# Patient Record
Sex: Male | Born: 1985 | Race: Black or African American | Hispanic: No | Marital: Single | Smoking: Never smoker
Health system: Southern US, Community
[De-identification: ages and names within clinical notes are randomized; demographics above are authoritative.]

## PROBLEM LIST (undated history)

## (undated) DIAGNOSIS — R0600 Dyspnea, unspecified: Secondary | ICD-10-CM

## (undated) DIAGNOSIS — N289 Disorder of kidney and ureter, unspecified: Secondary | ICD-10-CM

## (undated) DIAGNOSIS — K219 Gastro-esophageal reflux disease without esophagitis: Secondary | ICD-10-CM

## (undated) DIAGNOSIS — L659 Nonscarring hair loss, unspecified: Secondary | ICD-10-CM

## (undated) DIAGNOSIS — I10 Essential (primary) hypertension: Secondary | ICD-10-CM

## (undated) HISTORY — PX: SPLENECTOMY: SUR1306

## (undated) HISTORY — PX: WISDOM TOOTH EXTRACTION: SHX21

---

## 2005-08-10 ENCOUNTER — Emergency Department: Payer: Self-pay | Admitting: General Practice

## 2006-06-01 HISTORY — PX: SPLENECTOMY: SUR1306

## 2006-08-21 ENCOUNTER — Emergency Department: Payer: Self-pay | Admitting: Emergency Medicine

## 2006-08-21 ENCOUNTER — Other Ambulatory Visit: Payer: Self-pay

## 2007-10-01 ENCOUNTER — Emergency Department: Payer: Self-pay | Admitting: Emergency Medicine

## 2007-11-20 ENCOUNTER — Emergency Department: Payer: Self-pay | Admitting: Emergency Medicine

## 2009-01-30 ENCOUNTER — Emergency Department: Payer: Self-pay | Admitting: Emergency Medicine

## 2010-09-29 ENCOUNTER — Emergency Department: Payer: Self-pay | Admitting: Emergency Medicine

## 2012-08-26 ENCOUNTER — Emergency Department: Payer: Self-pay | Admitting: Emergency Medicine

## 2012-08-31 ENCOUNTER — Emergency Department: Payer: Self-pay | Admitting: Emergency Medicine

## 2012-10-22 ENCOUNTER — Emergency Department: Payer: Self-pay | Admitting: Emergency Medicine

## 2013-02-20 ENCOUNTER — Emergency Department: Payer: Self-pay | Admitting: Emergency Medicine

## 2013-02-20 LAB — BASIC METABOLIC PANEL
Chloride: 102 mmol/L (ref 98–107)
Creatinine: 0.76 mg/dL (ref 0.60–1.30)
Glucose: 70 mg/dL (ref 65–99)
Osmolality: 273 (ref 275–301)
Potassium: 4.1 mmol/L (ref 3.5–5.1)
Sodium: 137 mmol/L (ref 136–145)

## 2013-02-20 LAB — CBC
HGB: 18 g/dL (ref 13.0–18.0)
MCHC: 34.3 g/dL (ref 32.0–36.0)
Platelet: 561 10*3/uL — ABNORMAL HIGH (ref 150–440)

## 2013-02-20 LAB — TROPONIN I: Troponin-I: <0.02 ng/mL

## 2013-08-17 ENCOUNTER — Emergency Department: Payer: Self-pay | Admitting: Internal Medicine

## 2015-02-24 ENCOUNTER — Emergency Department
Admission: EM | Admit: 2015-02-24 | Discharge: 2015-02-25 | Disposition: A | Discharge status: Home / Self Care | Payer: BLUE CROSS/BLUE SHIELD | Attending: Emergency Medicine | Admitting: Emergency Medicine

## 2015-02-24 ENCOUNTER — Emergency Department: Payer: BLUE CROSS/BLUE SHIELD

## 2015-02-24 DIAGNOSIS — R112 Nausea with vomiting, unspecified: Secondary | ICD-10-CM | POA: Insufficient documentation

## 2015-02-24 DIAGNOSIS — R1031 Right lower quadrant pain: Secondary | ICD-10-CM | POA: Insufficient documentation

## 2015-02-24 HISTORY — DX: Disorder of kidney and ureter, unspecified: N28.9

## 2015-02-24 LAB — COMPREHENSIVE METABOLIC PANEL
ALBUMIN: 4.9 g/dL (ref 3.5–5.0)
ALK PHOS: 70 U/L (ref 38–126)
ALT: 31 U/L (ref 17–63)
AST: 30 U/L (ref 15–41)
Anion gap: 15 (ref 5–15)
BILIRUBIN TOTAL: 1.9 mg/dL — AB (ref 0.3–1.2)
BUN: 7 mg/dL (ref 6–20)
CO2: 27 mmol/L (ref 22–32)
Calcium: 9.8 mg/dL (ref 8.9–10.3)
Chloride: 97 mmol/L — ABNORMAL LOW (ref 101–111)
Creatinine, Ser: 0.65 mg/dL (ref 0.61–1.24)
GFR calc Af Amer: >60 mL/min (ref >60)
GFR calc non Af Amer: >60 mL/min (ref >60)
GLUCOSE: 94 mg/dL (ref 65–99)
POTASSIUM: 3.5 mmol/L (ref 3.5–5.1)
SODIUM: 139 mmol/L (ref 135–145)
TOTAL PROTEIN: 8.6 g/dL — AB (ref 6.5–8.1)

## 2015-02-24 LAB — CBC
HEMATOCRIT: 49.1 % (ref 40.0–52.0)
HEMOGLOBIN: 16.5 g/dL (ref 13.0–18.0)
MCH: 30.3 pg (ref 26.0–34.0)
MCHC: 33.6 g/dL (ref 32.0–36.0)
MCV: 90.1 fL (ref 80.0–100.0)
Platelets: 608 10*3/uL — ABNORMAL HIGH (ref 150–440)
RBC: 5.45 MIL/uL (ref 4.40–5.90)
RDW: 13.2 % (ref 11.5–14.5)
WBC: 18.2 10*3/uL — ABNORMAL HIGH (ref 3.8–10.6)

## 2015-02-24 LAB — LIPASE, BLOOD: Lipase: 14 U/L — ABNORMAL LOW (ref 22–51)

## 2015-02-24 MED ORDER — SODIUM CHLORIDE 0.9 % IV BOLUS (SEPSIS)
1000.0000 mL | Freq: Once | INTRAVENOUS | Status: AC
  Administered 2015-02-24: 1000 mL via INTRAVENOUS

## 2015-02-24 MED ORDER — MORPHINE SULFATE (PF) 4 MG/ML IV SOLN
4.0000 mg | Freq: Once | INTRAVENOUS | Status: AC
  Administered 2015-02-24: 4 mg via INTRAVENOUS
  Filled 2015-02-24: qty 1

## 2015-02-24 MED ORDER — ONDANSETRON HCL 4 MG/2ML IJ SOLN
4.0000 mg | Freq: Once | INTRAMUSCULAR | Status: AC
  Administered 2015-02-24: 4 mg via INTRAVENOUS
  Filled 2015-02-24: qty 2

## 2015-02-24 MED ORDER — IOHEXOL 240 MG/ML SOLN
25.0000 mL | Freq: Once | INTRAMUSCULAR | Status: AC | PRN
  Administered 2015-02-24: 25 mL via ORAL

## 2015-02-24 MED ORDER — ONDANSETRON HCL 4 MG/2ML IJ SOLN
4.0000 mg | Freq: Once | INTRAMUSCULAR | Status: AC | PRN
  Administered 2015-02-24: 4 mg via INTRAVENOUS
  Filled 2015-02-24: qty 2

## 2015-02-24 MED ORDER — IOHEXOL 300 MG/ML  SOLN
100.0000 mL | Freq: Once | INTRAMUSCULAR | Status: AC | PRN
  Administered 2015-02-24: 100 mL via INTRAVENOUS

## 2015-02-24 NOTE — ED Notes (Signed)
Patient transported to CT 

## 2015-02-24 NOTE — ED Notes (Signed)
Patient reports abdominal pain today with nausea and vomiting.  Patient to ED via EMS.

## 2015-02-24 NOTE — ED Provider Notes (Addendum)
[facility] Emergency Department Provider Note   ____________________________________________  Time seen:  I have reviewed the triage vital signs and the triage nursing note.  HISTORY  Chief Complaint Abdominal Pain   Historian Patient  HPI Alejandro Collier is a 29 y.o. male with a history of splenectomy, who is presenting with abdominal pain for several hours. Onset was gradual and now worse. He has had nausea and a few episodes of vomiting. Reports no bowel movement for 3 days. Pain is central/periumbilical and in the right side abdomen. Pain is moderate. Never had pain like this before. Not recently been ill.    Past Medical History  Diagnosis Date  . Renal disorder     There are no active problems to display for this patient.   Past Surgical History  Procedure Laterality Date  . Splenectomy  2008    No current outpatient prescriptions on file.  Allergies Review of patient's allergies indicates no known allergies.  No family history on file.  Social History Social History  Substance Use Topics  . Smoking status: Not on file  . Smokeless tobacco: Not on file  . Alcohol Use: Not on file    Review of Systems  Constitutional: Negative for fever. Eyes: Negative for visual changes. ENT: Negative for sore throat. Cardiovascular: Negative for chest pain. Respiratory: Negative for shortness of breath. Gastrointestinal: Per history of present illness Genitourinary: Negative for dysuria. Musculoskeletal: Negative for back pain. Skin: Negative for rash. Neurological: Negative for headache. 10 point Review of Systems otherwise negative ____________________________________________   PHYSICAL EXAM:  VITAL SIGNS: ED Triage Vitals  Enc Vitals Group     BP 02/24/15 2110 132/92 mmHg     Pulse Rate 02/24/15 2110 71     Resp 02/24/15 2110 20     Temp 02/24/15 2110 97.6 F (36.4 C)     Temp Source 02/24/15 2110 Oral     SpO2 02/24/15 2110  96 %     Weight 02/24/15 2110 150 lb (68.04 kg)     Height 02/24/15 2110  (1.778 m)     Head Cir --      Peak Flow --      Pain Score 02/24/15 2110 10     Pain Loc --      Pain Edu? --      Excl. in GC? --      Constitutional: Alert and oriented. Well appearing and in no distress. Eyes: Conjunctivae are normal. PERRL. Normal extraocular movements. ENT   Head: Normocephalic and atraumatic.   Nose: No congestion/rhinnorhea.   Mouth/Throat: Mucous membranes are moist.   Neck: No stridor. Cardiovascular/Chest: Normal rate, regular rhythm.  No murmurs, rubs, or gallops. Respiratory: Normal respiratory effort without tachypnea nor retractions. Breath sounds are clear and equal bilaterally. No wheezes/rales/rhonchi. Gastrointestinal: Soft. Moderate tenderness mid abdomen and right side. Mild guarding. No rebound. No distention. Genitourinary/rectal:Deferred Musculoskeletal: Nontender with normal range of motion in all extremities. No joint effusions.  No lower extremity tenderness.  No edema. Neurologic:  Normal speech and language. No gross or focal neurologic deficits are appreciated. Skin:  Skin is warm, dry and intact. No rash noted. Psychiatric: Mood and affect are normal. Speech and behavior are normal. Patient exhibits appropriate insight and judgment.  ____________________________________________   EKG I, Governor Rooks, MD, the attending physician have personally viewed and interpreted all ECGs.  No EKG performed ____________________________________________  LABS (pertinent positives/negatives)  Lipase 14 Comprehensive metabolic panel without significant abnormality White blood  count 18.2 Hemoglobin 16.5 Platelet count 608 Urinalysis pending ____________________________________________  RADIOLOGY All Xrays were viewed by me. Imaging interpreted by Radiologist.  CT abdomen and pelvis with contrast:  Pending __________________________________________  PROCEDURES  Procedure(s) performed: None  Critical Care performed: None  ____________________________________________   ED COURSE / ASSESSMENT AND PLAN  CONSULTATIONS: None  Pertinent labs & imaging results that were available during my care of the patient were reviewed by me and considered in my medical decision making (see chart for details).  Patient's abdominal pain is central and somewhat right-sided raising concern for the possibility of appendicitis. His white blood cell count is elevated. He has had abdominal surgery in the past, is reporting no bowel movement for couple days, so obstruction is possibly, although he is having no abdominal distention.  And shared decision making I discussed with the patient I recommend CT scan of the abdomen for further evaluation. Patient was treated for symptoms with morphine and Zofran as well as fluid bolus.  CT scan of abdomen and pelvis with contrast and urinalysis are pending. Patient care transferred to Dr. Dolores Frame at shift change.  Patient / Family / Caregiver informed of clinical course, medical decision-making process, and agree with plan.    ___________________________________________   FINAL CLINICAL IMPRESSION(S) / ED DIAGNOSES   Final diagnoses:  Abdominal pain, right lower quadrant       Governor Rooks, MD 02/24/15 2311  Governor Rooks, MD 02/24/15 2312

## 2015-02-24 NOTE — ED Notes (Signed)
CT called for pt finished contrast  

## 2015-02-25 LAB — URINALYSIS COMPLETE WITH MICROSCOPIC (ARMC ONLY)
BACTERIA UA: NONE SEEN
Bilirubin Urine: NEGATIVE
Glucose, UA: NEGATIVE mg/dL
Hgb urine dipstick: NEGATIVE
Leukocytes, UA: NEGATIVE
Nitrite: NEGATIVE
PROTEIN: 100 mg/dL — AB
Specific Gravity, Urine: >1.06 — ABNORMAL HIGH (ref 1.005–1.030)
Squamous Epithelial / LPF: NONE SEEN
pH: 5 (ref 5.0–8.0)

## 2015-02-25 NOTE — ED Provider Notes (Signed)
-----------------------------------------   1:37 AM on 02/25/2015 -----------------------------------------  CT Abdomen/Pelvis w/ Contrast interpreted per Dr. Manus Gunning: 1. No definite acute abnormality in the abdomen/pelvis. 2. Trace free fluid in the pelvis is nonspecific, however likely reactive. 3. 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  Updated patient of CT results. Patient texting on the phone. Patient will attempt to provide urine specimen. Patient asking for Orange juice.   ----------------------------------------- 2:38 AM on 02/25/2015 -----------------------------------------  Updated patient of urinalysis results. Repeat exam and abdomen; patient has no tenderness, particularly in right upper quadrant. Strict return precautions given. Patient verbalizes understanding and agrees with plan of care.   Irean Hong, MD 02/25/15 [phone]

## 2015-02-25 NOTE — Discharge Instructions (Signed)
Return to the ER for worsening symptoms, persistent vomiting, fever, difficulty breathing or other concerns.  Abdominal Pain Many things can cause abdominal pain. Usually, abdominal pain is not caused by a disease and will improve without treatment. It can often be observed and treated at home. Your health care provider will do a physical exam and possibly order blood tests and X-rays to help determine the seriousness of your pain. However, in many cases, more time must pass before a clear cause of the pain can be found. Before that point, your health care provider may not know if you need more testing or further treatment. HOME CARE INSTRUCTIONS  Monitor your abdominal pain for any changes. The following actions may help to alleviate any discomfort you are experiencing:  Only take over-the-counter or prescription medicines as directed by your health care provider.  Do not take laxatives unless directed to do so by your health care provider.  Try a clear liquid diet (broth, tea, or water) as directed by your health care provider. Slowly move to a bland diet as tolerated. SEEK MEDICAL CARE IF:  You have unexplained abdominal pain.  You have abdominal pain associated with nausea or diarrhea.  You have pain when you urinate or have a bowel movement.  You experience abdominal pain that wakes you in the night.  You have abdominal pain that is worsened or improved by eating food.  You have abdominal pain that is worsened with eating fatty foods.  You have a fever. SEEK IMMEDIATE MEDICAL CARE IF:   Your pain does not go away within 2 hours.  You keep throwing up (vomiting).  Your pain is felt only in portions of the abdomen, such as the right side or the left lower portion of the abdomen.  You pass bloody or black tarry stools. MAKE SURE YOU:  Understand these instructions.   Will watch your condition.   Will get help right away if you are not doing well or get worse.  Document  Released: 02/25/2005 Document Revised: 05/23/2013 Document Reviewed: 01/25/2013 [facility] Patient Information 2015 [place], [place]. This information is not intended to replace advice given to you by your health care provider. Make sure you discuss any questions you have with your health care provider.

## 2015-08-31 ENCOUNTER — Encounter (HOSPITAL_COMMUNITY): Payer: Self-pay

## 2015-08-31 ENCOUNTER — Emergency Department (HOSPITAL_COMMUNITY)
Admission: EM | Admit: 2015-08-31 | Discharge: 2015-08-31 | Disposition: A | Discharge status: Home / Self Care | Payer: BLUE CROSS/BLUE SHIELD | Attending: Emergency Medicine | Admitting: Emergency Medicine

## 2015-08-31 DIAGNOSIS — Z87448 Personal history of other diseases of urinary system: Secondary | ICD-10-CM | POA: Insufficient documentation

## 2015-08-31 DIAGNOSIS — I10 Essential (primary) hypertension: Secondary | ICD-10-CM | POA: Insufficient documentation

## 2015-08-31 DIAGNOSIS — R251 Tremor, unspecified: Secondary | ICD-10-CM | POA: Insufficient documentation

## 2015-08-31 DIAGNOSIS — F419 Anxiety disorder, unspecified: Secondary | ICD-10-CM | POA: Insufficient documentation

## 2015-08-31 DIAGNOSIS — R000 Tachycardia, unspecified: Secondary | ICD-10-CM | POA: Insufficient documentation

## 2015-08-31 DIAGNOSIS — T50905A Adverse effect of unspecified drugs, medicaments and biological substances, initial encounter: Secondary | ICD-10-CM | POA: Insufficient documentation

## 2015-08-31 HISTORY — DX: Essential (primary) hypertension: I10

## 2015-08-31 LAB — CBC
HEMATOCRIT: 43.9 % (ref 39.0–52.0)
HEMOGLOBIN: 15.7 g/dL (ref 13.0–17.0)
MCH: 32.3 pg (ref 26.0–34.0)
MCHC: 35.8 g/dL (ref 30.0–36.0)
MCV: 90.3 fL (ref 78.0–100.0)
Platelets: 360 10*3/uL (ref 150–400)
RBC: 4.86 MIL/uL (ref 4.22–5.81)
RDW: 13.5 % (ref 11.5–15.5)
WBC: 13.4 10*3/uL — AB (ref 4.0–10.5)

## 2015-08-31 LAB — BASIC METABOLIC PANEL
ANION GAP: 12 (ref 5–15)
BUN: <5 mg/dL — ABNORMAL LOW (ref 6–20)
CALCIUM: 9.3 mg/dL (ref 8.9–10.3)
CO2: 25 mmol/L (ref 22–32)
Chloride: 93 mmol/L — ABNORMAL LOW (ref 101–111)
Creatinine, Ser: 0.73 mg/dL (ref 0.61–1.24)
GLUCOSE: 106 mg/dL — AB (ref 65–99)
Potassium: 4.2 mmol/L (ref 3.5–5.1)
SODIUM: 130 mmol/L — AB (ref 135–145)

## 2015-08-31 LAB — ETHANOL

## 2015-08-31 MED ORDER — SODIUM CHLORIDE 0.9 % IV BOLUS (SEPSIS)
1000.0000 mL | Freq: Once | INTRAVENOUS | Status: AC
  Administered 2015-08-31: 1000 mL via INTRAVENOUS

## 2015-08-31 MED ORDER — LORAZEPAM 2 MG/ML IJ SOLN
1.0000 mg | Freq: Once | INTRAMUSCULAR | Status: AC
  Administered 2015-08-31: 1 mg via INTRAVENOUS
  Filled 2015-08-31: qty 1

## 2015-08-31 NOTE — ED Notes (Signed)
Canceled code stemi  

## 2015-08-31 NOTE — ED Provider Notes (Signed)
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 He noticed when he woke up this morning at 6 AM he was feeling very shaky and anxious. Those symptoms have persisted throughout the day and have not resolved. Patient is not having any trouble with chest pain. He denies any shortness of breath. No nausea vomiting or fevers. He drank some alcohol last night but denies any other drug use. He denies any suicidal or homicidal ideations. He is not feeling depressed  He has used this drug previously and has not had a reaction like this. Past Medical History  Diagnosis Date  . Renal disorder   . Hypertension    Past Surgical History  Procedure Laterality Date  . Splenectomy  2008  . Splenectomy     History reviewed. No pertinent family history. Social History  Substance Use Topics  . Smoking status: Never Smoker   . Smokeless tobacco: None  . Alcohol Use: 12.6 oz/week    21 Cans of beer per week    Review of Systems  All other systems reviewed and are negative.     Allergies  Review of patient's allergies indicates no known allergies.  Home Medications   Prior to Admission medications   Not on File   BP 165/86 mmHg  Pulse 93  Temp(Src) 99 F (37.2 C) (Oral)  Resp 20  Ht  (1.778 m)  Wt 68.04 kg  BMI 21.52 kg/m2  SpO2 100% Physical Exam  Constitutional: He appears well-developed and well-nourished. No distress.  HENT:  Head: Normocephalic and atraumatic.  Right Ear: External ear normal.  Left Ear: External ear normal.  Eyes: Conjunctivae are normal. Right eye exhibits no discharge. Left eye exhibits no discharge. No scleral icterus.  Neck: Neck supple. No tracheal deviation present.  Cardiovascular: Regular rhythm and intact distal pulses.  Tachycardia present.   Pulmonary/Chest: Effort normal and breath sounds  normal. No stridor. No respiratory distress. He has no wheezes. He has no rales.  Abdominal: Soft. Bowel sounds are normal. He exhibits no distension. There is no tenderness. There is no rebound and no guarding.  Musculoskeletal: He exhibits no edema or tenderness.  Neurological: He is alert. He has normal strength. He displays tremor. No cranial nerve deficit (no facial droop, extraocular movements intact, no slurred speech) or sensory deficit. He exhibits normal muscle tone. He displays no seizure activity. Coordination normal.  Skin: Skin is warm. No rash noted.  Diaphoresis noted on the scalp  Psychiatric: He has a normal mood and affect.  Nursing note and vitals reviewed.   ED Course  Procedures (including critical care time) Labs Review Labs Reviewed  BASIC METABOLIC PANEL - Abnormal; Notable for the following:    Sodium 130 (*)    Chloride 93 (*)    Glucose, Bld 106 (*)    BUN <5 (*)    All other components within normal limits  CBC - Abnormal; Notable for the following:    WBC 13.4 (*)    All other components within normal limits  ETHANOL  URINE RAPID DRUG SCREEN, HOSP PERFORMED    Imaging Review No results found. I have personally reviewed and evaluated these images and lab results as part of my medical decision-making.   EKG Interpretation   Date/Time:  Saturday August 31 2015 13:59:53 EDT Ventricular Rate:  97 PR Interval:  131 QRS Duration: 92 QT Interval:  330 QTC Calculation: 419 R Axis:   70 Text Interpretation:  Sinus rhythm Probable left atrial enlargement RSR'  in V1 or V2, right VCD or RVH Probable left ventricular hypertrophy ST  elev, probable normal early repol pattern , noted on prior ECG Confirmed  by Shaarav Ripple  MD-J, Gizella [place] ([id number]) on 08/31/2015 2:02:38 PM     Medications  sodium chloride 0.9 % bolus 1,000 mL (0 mLs Intravenous Stopped 08/31/15 1500)  LORazepam (ATIVAN) injection 1 mg (1 mg Intravenous Given 08/31/15 1415)    MDM   Final diagnoses:   Adverse drug reaction, initial encounter    Anxiety has improved with ativan.  Sx are related to his illegal drug use.  No signs of life threatening toxicity.  Stable for discharge.    Linwood DibblesJon Kennisha Qin, MD 08/31/15 [phone]

## 2015-08-31 NOTE — ED Notes (Signed)
Pt reports taking Molly last night and feeling "not right" since 0600 this morning.  Pt reports anxiety and jitteriness.  Pt denies any pain at this time.

## 2015-08-31 NOTE — Discharge Instructions (Signed)

## 2016-10-24 ENCOUNTER — Emergency Department
Admission: EM | Admit: 2016-10-24 | Discharge: 2016-10-24 | Disposition: A | Discharge status: Home / Self Care | Payer: BLUE CROSS/BLUE SHIELD | Attending: Emergency Medicine | Admitting: Emergency Medicine

## 2016-10-24 ENCOUNTER — Emergency Department: Payer: BLUE CROSS/BLUE SHIELD

## 2016-10-24 DIAGNOSIS — I10 Essential (primary) hypertension: Secondary | ICD-10-CM | POA: Insufficient documentation

## 2016-10-24 DIAGNOSIS — R079 Chest pain, unspecified: Secondary | ICD-10-CM | POA: Insufficient documentation

## 2016-10-24 LAB — BASIC METABOLIC PANEL
Anion gap: 10 (ref 5–15)
BUN: 7 mg/dL (ref 6–20)
CO2: 28 mmol/L (ref 22–32)
CREATININE: 0.74 mg/dL (ref 0.61–1.24)
Calcium: 9.1 mg/dL (ref 8.9–10.3)
Chloride: 103 mmol/L (ref 101–111)
GFR calc Af Amer: >60 mL/min (ref >60)
GLUCOSE: 92 mg/dL (ref 65–99)
Potassium: 3.3 mmol/L — ABNORMAL LOW (ref 3.5–5.1)
SODIUM: 141 mmol/L (ref 135–145)

## 2016-10-24 LAB — CBC
HCT: 46 % (ref 40.0–52.0)
Hemoglobin: 15.8 g/dL (ref 13.0–18.0)
MCH: 31.2 pg (ref 26.0–34.0)
MCHC: 34.2 g/dL (ref 32.0–36.0)
MCV: 91.1 fL (ref 80.0–100.0)
PLATELETS: 442 10*3/uL — AB (ref 150–440)
RBC: 5.05 MIL/uL (ref 4.40–5.90)
RDW: 13.7 % (ref 11.5–14.5)
WBC: 15 10*3/uL — ABNORMAL HIGH (ref 3.8–10.6)

## 2016-10-24 LAB — TROPONIN I
Troponin I: <0.03 ng/mL (ref <0.03)
Troponin I: <0.03 ng/mL (ref <0.03)

## 2016-10-24 MED ORDER — LISINOPRIL 5 MG PO TABS: 5.0000 mg | ORAL_TABLET | Freq: Every day | ORAL | 1 refills | Status: DC

## 2016-10-24 MED ORDER — ASPIRIN 81 MG PO CHEW
324.0000 mg | CHEWABLE_TABLET | Freq: Once | ORAL | Status: AC
  Administered 2016-10-24: 324 mg via ORAL
  Filled 2016-10-24: qty 4

## 2016-10-24 NOTE — ED Provider Notes (Signed)
[facility] Emergency Department Provider Note  ____________________________________________  Time seen: Approximately 5:33 AM  I have reviewed the triage vital signs and the nursing notes.   HISTORY  Chief Complaint Chest Pain   HPI Alejandro Collier is a 31 y.o. male with a history of IGA nephropathy, hypertension, and alcohol abuse who presents for evaluation of chest pain. Patient reports that he has daily episodes of chest pain that he describes as a dull pain located in the center of his chest, nonradiating, constant lasting for a few hours at a time and not associated with nausea, vomiting, shortness of breath or diaphoresis. Patient reports that he is here today because his family asked him to come to be evaluated. He has not been compliant with his antihypertensives for more than a year. He reports that he drinks 6 x 24 ounce beers a day. He denies drug use. He denies personal or family history of ischemic heart disease. Patient reports that he has had these episodes once a day with the last episode at 3 AM this morning. He is asymptomatic at this time. No abdominal pain, no melena, no coffee ground emesis, no fever or chills, no URI symptoms, no dysuria hematuria.  Past Medical History:  Diagnosis Date  . Hypertension   . Renal disorder     There are no active problems to display for this patient.   Past Surgical History:  Procedure Laterality Date  . SPLENECTOMY  2008  . SPLENECTOMY      Prior to Admission medications   Medication Sig Start Date End Date Taking? Authorizing Provider  lisinopril (PRINIVIL,ZESTRIL) 5 MG tablet Take 1 tablet (5 mg total) by mouth daily. 10/24/16 11/23/16  Nita SickleVeronese, [place] , MD    Allergies Patient has no known allergies.  Family History No fh of ischemic heart disease  Social History Social History  Substance Use Topics  . Smoking status: Never Smoker  . Smokeless tobacco: Not on file  . Alcohol use 12.6  oz/week    21 Cans of beer per week    Review of Systems  Constitutional: Negative for fever. Eyes: Negative for visual changes. ENT: Negative for sore throat. Neck: No neck pain  Cardiovascular: + chest pain. Respiratory: Negative for shortness of breath. Gastrointestinal: Negative for abdominal pain, vomiting or diarrhea. Genitourinary: Negative for dysuria. Musculoskeletal: Negative for back pain. Skin: Negative for rash. Neurological: Negative for headaches, weakness or numbness. Psych: No SI or HI  ____________________________________________   PHYSICAL EXAM:  VITAL SIGNS: ED Triage Vitals  Enc Vitals Group     BP 10/24/16 0359 (!) 155/98     Pulse Rate 10/24/16 0359 75     Resp 10/24/16 0359 18     Temp 10/24/16 0359 97.4 F (36.3 C)     Temp Source 10/24/16 0359 Oral     SpO2 10/24/16 0359 100 %     Weight 10/24/16 0357 150 lb (68 kg)     Height 10/24/16 0357 5\' 10"  (1.778 m)     Head Circumference --      Peak Flow --      Pain Score --      Pain Loc --      Pain Edu? --      Excl. in GC? --     Constitutional: Alert and oriented. Well appearing and in no apparent distress. HEENT:      Head: Normocephalic and atraumatic.         Eyes: Conjunctivae are  normal. Sclera is non-icteric.       Mouth/Throat: Mucous membranes are moist.       Neck: Supple with no signs of meningismus. Cardiovascular: Regular rate and rhythm. No murmurs, gallops, or rubs. 2+ symmetrical distal pulses are present in all extremities. No JVD. Respiratory: Normal respiratory effort. Lungs are clear to auscultation bilaterally. No wheezes, crackles, or rhonchi.  Gastrointestinal: Soft, non tender, and non distended with positive bowel sounds. No rebound or guarding. Genitourinary: No CVA tenderness. Musculoskeletal: Nontender with normal range of motion in all extremities. No edema, cyanosis, or erythema of extremities. Neurologic: Normal speech and language. Face is symmetric. Moving  all extremities. No gross focal neurologic deficits are appreciated. Skin: Skin is warm, dry and intact. No rash noted. Psychiatric: Mood and affect are normal. Speech and behavior are normal.  ____________________________________________   LABS (all labs ordered are listed, but only abnormal results are displayed)  Labs Reviewed  BASIC METABOLIC PANEL - Abnormal; Notable for the following:       Result Value   Potassium 3.3 (*)    All other components within normal limits  CBC - Abnormal; Notable for the following:    WBC 15.0 (*)    Platelets 442 (*)    All other components within normal limits  TROPONIN I  TROPONIN I   ____________________________________________  EKG  ED ECG REPORT I, Nita Sickle, the attending physician, personally viewed and interpreted this ECG.  Normal sinus rhythm, rate of 74, normal intervals, normal axis, LVH, no ST elevations or depressions. Unchanged from prior from April 2017 ____________________________________________  RADIOLOGY  CXR:  Negative ____________________________________________   PROCEDURES  Procedure(s) performed: None Procedures Critical Care performed:  None ____________________________________________   INITIAL IMPRESSION / ASSESSMENT AND PLAN / ED COURSE  Chest pain in a 31 y.o. male with low suspicion for cardiac (HEART score 1) or other serious etiology (including aortic dissection, pneumonia, pneumothorax, or pulmonary embolism) based his history and physical exam in the ED today. EKG normal. Plan for labs including CBC, chemistries and troponin now and in 3 hours, CXR and re-evaluation for disposition. Will give full dose ASA. Will observe patient on cardiac monitor while in the ED and pain control. If work up negative in the ED will refer to cardiology for outpatient evaluation. Discussed risks of medication non compliance and heavy drinking with patient.  ----------------------------------------- 5:40 AM on  10/24/2016 -----------------------------------------   OBSERVATION CARE: This patient is being placed under observation care for the following reasons: Chest pain with repeat testing to rule out ischemia   ----------------------------------------- 7:00 AM on 10/24/2016 ----------------------------------------- Patient remains CP free. 2nd troponin pending.Care transferred to Dr. Alphonzo Lemmings.     Pertinent labs & imaging results that were available during my care of the patient were reviewed by me and considered in my medical decision making (see chart for details).    ____________________________________________   FINAL CLINICAL IMPRESSION(S) / ED DIAGNOSES  Final diagnoses:  Chest pain, unspecified type      NEW MEDICATIONS STARTED DURING THIS VISIT:  Discharge Medication List as of 10/24/2016  6:41 AM    START taking these medications   Details  lisinopril (PRINIVIL,ZESTRIL) 5 MG tablet Take 1 tablet (5 mg total) by mouth daily., Starting Sat 10/24/2016, Until Mon 11/23/2016, Print         Note:  This document was prepared using Dragon voice recognition software and may include unintentional dictation errors.    Nita Sickle, MD 10/25/16 [phone]

## 2016-10-24 NOTE — ED Triage Notes (Signed)
Patient reports having pain across his chest for several month.  When ask what was different tonight, patient does not respond.

## 2016-10-24 NOTE — Discharge Instructions (Signed)

## 2016-10-24 NOTE — ED Notes (Signed)
Troponin drawn and sent to lab.

## 2017-07-19 ENCOUNTER — Encounter: Payer: Self-pay | Admitting: Emergency Medicine

## 2017-07-19 ENCOUNTER — Emergency Department: Payer: 59

## 2017-07-19 ENCOUNTER — Other Ambulatory Visit: Payer: Self-pay

## 2017-07-19 ENCOUNTER — Emergency Department
Admission: EM | Admit: 2017-07-19 | Discharge: 2017-07-19 | Disposition: A | Discharge status: Home / Self Care | Payer: 59 | Attending: Emergency Medicine | Admitting: Emergency Medicine

## 2017-07-19 DIAGNOSIS — I10 Essential (primary) hypertension: Secondary | ICD-10-CM | POA: Insufficient documentation

## 2017-07-19 DIAGNOSIS — Y999 Unspecified external cause status: Secondary | ICD-10-CM | POA: Insufficient documentation

## 2017-07-19 DIAGNOSIS — Y939 Activity, unspecified: Secondary | ICD-10-CM | POA: Insufficient documentation

## 2017-07-19 DIAGNOSIS — S82401A Unspecified fracture of shaft of right fibula, initial encounter for closed fracture: Secondary | ICD-10-CM

## 2017-07-19 DIAGNOSIS — R52 Pain, unspecified: Secondary | ICD-10-CM

## 2017-07-19 DIAGNOSIS — Y929 Unspecified place or not applicable: Secondary | ICD-10-CM | POA: Diagnosis not present

## 2017-07-19 DIAGNOSIS — W010XXA Fall on same level from slipping, tripping and stumbling without subsequent striking against object, initial encounter: Secondary | ICD-10-CM | POA: Diagnosis not present

## 2017-07-19 DIAGNOSIS — S99911A Unspecified injury of right ankle, initial encounter: Secondary | ICD-10-CM | POA: Diagnosis present

## 2017-07-19 MED ORDER — IBUPROFEN 800 MG PO TABS: 800.0000 mg | ORAL_TABLET | Freq: Three times a day (TID) | ORAL | 0 refills | Status: DC | PRN

## 2017-07-19 MED ORDER — IBUPROFEN 800 MG PO TABS
800.0000 mg | ORAL_TABLET | Freq: Once | ORAL | Status: AC
  Administered 2017-07-19: 800 mg via ORAL
  Filled 2017-07-19: qty 1

## 2017-07-19 MED ORDER — OXYCODONE-ACETAMINOPHEN 5-325 MG PO TABS: 1.0000 | ORAL_TABLET | Freq: Four times a day (QID) | ORAL | 0 refills | Status: DC | PRN

## 2017-07-19 MED ORDER — OXYCODONE-ACETAMINOPHEN 5-325 MG PO TABS
1.0000 | ORAL_TABLET | Freq: Once | ORAL | Status: AC
  Administered 2017-07-19: 1 via ORAL
  Filled 2017-07-19: qty 1

## 2017-07-19 NOTE — ED Triage Notes (Signed)
Pt to ed with c/o right ankle pain since last night when he fell.  Pt states he tripped and fell from standing. swelling noted to right ankle. Pt states unable to bear weight on ankle.

## 2017-07-19 NOTE — Discharge Instructions (Signed)
Splint and ambulate with crutches until evaluation by orthopedics °

## 2017-07-19 NOTE — ED Provider Notes (Signed)
[facility] Emergency Department Provider Note   ____________________________________________   First MD Initiated Contact with Patient 07/19/17 1112     (approximate)  I have reviewed the triage vital signs and the nursing notes.   HISTORY  Chief Complaint Ankle Pain    HPI Alejandro Collier is a 32 y.o. male patient complaining of right ankle pain secondary to a trip and fall last night.  Obvious edema to the right ankle.  Patient unable to bear weight secondary to complaint of pain.  Patient rates pain as a 10/10.  Patient described the pain is "aching".  No pulses measured prior to arrival.  Past Medical History:  Diagnosis Date  . Hypertension   . Renal disorder     There are no active problems to display for this patient.   Past Surgical History:  Procedure Laterality Date  . SPLENECTOMY  2008  . SPLENECTOMY      Prior to Admission medications   Medication Sig Start Date End Date Taking? Authorizing Provider  ibuprofen (ADVIL,MOTRIN) 800 MG tablet Take 1 tablet (800 mg total) by mouth every 8 (eight) hours as needed. 07/19/17   Joni Reining, PA-C  lisinopril (PRINIVIL,ZESTRIL) 5 MG tablet Take 1 tablet (5 mg total) by mouth daily. 10/24/16 11/23/16  Nita Sickle, MD    Allergies Patient has no known allergies.  History reviewed. No pertinent family history.  Social History Social History   Tobacco Use  . Smoking status: Never Smoker  . Smokeless tobacco: Never Used  Substance Use Topics  . Alcohol use: Yes    Alcohol/week: 12.6 oz    Types: 21 Cans of beer per week  . Drug use: No    Comment: "molly"    Review of Systems Constitutional: No fever/chills Eyes: No visual changes. ENT: No sore throat. Cardiovascular: Denies chest pain. Respiratory: Denies shortness of breath. Gastrointestinal: No abdominal pain.  No nausea, no vomiting.  No diarrhea.  No constipation. Genitourinary: Negative for  dysuria. Musculoskeletal: Right ankle pain.   Skin: Negative for rash. Neurological: Negative for headaches, focal weakness or numbness. Endocrine:Hypertension Hematological/Lymphatic:   ____________________________________________   PHYSICAL EXAM:  VITAL SIGNS: ED Triage Vitals [07/19/17 1003]  Enc Vitals Group     BP 124/69     Pulse Rate 82     Resp 16     Temp 98.2 F (36.8 C)     Temp Source Oral     SpO2 99 %     Weight 147 lb (66.7 kg)     Height      Head Circumference      Peak Flow      Pain Score 10     Pain Loc      Pain Edu?      Excl. in GC?    Constitutional: Alert and oriented. Well appearing and in no acute distress. Neck: No stridor.  No cervical spine tenderness to palpation. Cardiovascular: Normal rate, regular rhythm. Grossly normal heart sounds.  Good peripheral circulation. Respiratory: Normal respiratory effort.  No retractions. Lungs CTAB. Gastrointestinal: Soft and nontender. No distention. No abdominal bruits. No CVA tenderness. Musculoskeletal: No obvious deformity to the right ankle.  Bilateral ankle edema.  Moderate guarding with palpation distal fibula and fibula. Neurologic:  Normal speech and language. No gross focal neurologic deficits are appreciated. No gait instability. Skin:  Skin is warm, dry and intact. No rash noted. Psychiatric: Mood and affect are normal. Speech and behavior are normal.  ____________________________________________   LABS (all labs ordered are listed, but only abnormal results are displayed)  Labs Reviewed - No data to display ____________________________________________  EKG   ____________________________________________  RADIOLOGY  ED MD interpretation: Comminuted fracture of the distal left fibular.  Official radiology report(s): Dg Ankle Complete Right  Result Date: 07/19/2017 CLINICAL DATA:  Pain with fall EXAM: RIGHT ANKLE - COMPLETE 3+ VIEW COMPARISON:  None. FINDINGS: Frontal, oblique,  and lateral views were obtained. There is an obliquely oriented mildly comminuted fracture of the distal fibular diaphysis with slight displacement of fracture fragments. Specifically, there is slight lateral and posterior displacement of the distal major fracture fragment with respect to the major proximal fragment. There is generalized soft tissue swelling in the ankle region. No other fracture is evident. There appears to be mild widening of the mortise along the lateral aspect, raising concern for a degree of mortise instability. This finding is most apparent on the oblique view. There is a small metallic foreign body in the soft tissues medial and dorsal to the navicular. There is no appreciable joint space narrowing or erosion. IMPRESSION: Soft tissue swelling with mildly comminuted fracture of the distal fibula. Question mild ankle mortise instability, suspected on the lateral view. Small radiopaque foreign body in the soft tissues dorsal and medial to the navicular. Electronically Signed   By: Bretta BangWilliam  Woodruff III M.D.   On: 07/19/2017 11:47   Dg Foot Complete Right  Result Date: 07/19/2017 CLINICAL DATA:  Acute right foot pain following fall yesterday. Initial encounter. EXAM: RIGHT FOOT COMPLETE - 3+ VIEW COMPARISON:  None. FINDINGS: An oblique fracture of the distal fibula is noted with 4 mm dorsal displacement. A posterior malleolar tibial fracture is noted. A probable medial malleolar tibial fracture is present. No other fracture, subluxation or dislocation identified. IMPRESSION: 1. Oblique fracture of the distal fibula with 4 mm dorsal displacement. 2. Posterior malleolar tibial fracture 3. Probable medial malleolar tibial fracture. 4. Recommended dedicated right ankle radiographs for further evaluation. Electronically Signed   By: Harmon PierJeffrey  Hu M.D.   On: 07/19/2017 10:46    ____________________________________________   PROCEDURES  Procedure(s) performed: None  Procedures  Critical  Care performed: No  ____________________________________________   INITIAL IMPRESSION / ASSESSMENT AND PLAN / ED COURSE  As part of my medical decision making, I reviewed the following data within the electronic MEDICAL RECORD NUMBER    Distal left fibular fracture.  Discussed x-ray findings with patient.  Patient placed in posterior ankle splint and given crutches for ambulation.  Patient advised to contact orthopedics today to schedule follow-up appointment.  Patient given prescription for Percocet and ibuprofen.      ____________________________________________   FINAL CLINICAL IMPRESSION(S) / ED DIAGNOSES  Final diagnoses:  Closed fracture of shaft of right fibula, unspecified fracture morphology, initial encounter     ED Discharge Orders        Ordered    oxyCODONE-acetaminophen (PERCOCET/ROXICET) 5-325 MG tablet  Every 6 hours PRN,   Status:  Discontinued     07/19/17 1200    ibuprofen (ADVIL,MOTRIN) 800 MG tablet  Every 8 hours PRN,   Status:  Discontinued     07/19/17 1200    ibuprofen (ADVIL,MOTRIN) 800 MG tablet  Every 8 hours PRN,   Status:  Discontinued     07/19/17 1202    oxyCODONE-acetaminophen (PERCOCET/ROXICET) 5-325 MG tablet  Every 6 hours PRN,   Status:  Discontinued     07/19/17 1202    ibuprofen (ADVIL,MOTRIN) 800 MG  tablet  Every 8 hours PRN     07/19/17 1205    oxyCODONE-acetaminophen (PERCOCET/ROXICET) 5-325 MG tablet  Every 6 hours PRN,   Status:  Discontinued     07/19/17 1205       Note:  This document was prepared using Dragon voice recognition software and may include unintentional dictation errors.    Joni Reining, PA-C 07/19/17 1214    Emily Filbert, MD 07/19/17 [phone]

## 2017-07-19 NOTE — ED Notes (Signed)
See triage note  States he fell last pm  Twisted right ankle  Swelling and tenderness noted to ankle/foot  Positive pulses

## 2017-07-27 ENCOUNTER — Other Ambulatory Visit: Payer: Self-pay

## 2017-07-27 ENCOUNTER — Encounter
Admission: RE | Admit: 2017-07-27 | Discharge: 2017-07-27 | Disposition: A | Discharge status: Home / Self Care | Payer: BLUE CROSS/BLUE SHIELD | Source: Ambulatory Visit | Attending: Orthopedic Surgery | Admitting: Orthopedic Surgery

## 2017-07-27 HISTORY — DX: Gastro-esophageal reflux disease without esophagitis: K21.9

## 2017-07-27 HISTORY — DX: Dyspnea, unspecified: R06.00

## 2017-07-27 NOTE — Patient Instructions (Signed)
Your procedure is scheduled on: 07-30-17 FRIDAY Report to Same Day Surgery 2nd floor medical mall [facility](Medical Mall Entrance-take elevator on left to 2nd floor.  Check in with surgery information desk.) To find out your arrival time please call [phone] between 1PM - 3PM on 07-29-17 THURSDAY  Remember: Instructions that are not followed completely may result in serious medical risk, up to and including death, or upon the discretion of your surgeon and anesthesiologist your surgery may need to be rescheduled.    _x___ 1. Do not eat food after midnight the night before your procedure. NO GUM OR CANDY AFTER MIDNIGHT.  You may drink clear liquids up to 2 hours before you are scheduled to arrive at the hospital for your procedure.  Do not drink clear liquids within 2 hours of your scheduled arrival to the hospital.  Clear liquids include  --Water or Apple juice without pulp  --Clear carbohydrate beverage such as ClearFast or Gatorade  --Black Coffee or Clear Tea (No milk, no creamers, do not add anything to the coffee or Tea      __x__ 2. No Alcohol for 24 hours before or after surgery.   __x__3. No Smoking or e-cigarettes for 24 prior to surgery.  Do not use any chewable tobacco products for at least 6 hour prior to surgery   ____  4. Bring all medications with you on the day of surgery if instructed.    __x__ 5. Notify your doctor if there is any change in your medical condition     (cold, fever, infections).    x___6. On the morning of surgery brush your teeth with toothpaste and water.  You may rinse your mouth with mouth wash if you wish.  Do not swallow any toothpaste or mouthwash.   Do not wear jewelry, make-up, hairpins, clips or nail polish.  Do not wear lotions, powders, or perfumes. You may wear deodorant.  Do not shave 48 hours prior to surgery. Men may shave face and neck.  Do not bring valuables to the hospital.    [facility] is not responsible for any belongings or  valuables.               Contacts, dentures or bridgework may not be worn into surgery.  Leave your suitcase in the car. After surgery it may be brought to your room.  For patients admitted to the hospital, discharge time is determined by your treatment team.  _  Patients discharged the day of surgery will not be allowed to drive home.  You will need someone to drive you home and stay with you the night of your procedure.    Please read over the following fact sheets that you were given:   [facility] Preparing for Surgery and or MRSA Information   _x___ Take anti-hypertensive listed below, cardiac, seizure, asthma, anti-reflux and psychiatric medicines. These include:  1. YOU MAY TAKE PERCOCET AM OF SURGERY IF NEEDED WITH A SMALL SIP OF WATER  2.  3.  4.  5.  6.  ____Fleets enema or Magnesium Citrate as directed.   _x___ Use CHG Soap or sage wipes as directed on instruction sheet   ____ Use inhalers on the day of surgery and bring to hospital day of surgery  ____ Stop Metformin and Janumet 2 days prior to surgery.    ____ Take 1/2 of usual insulin dose the night before surgery and none on the morning surgery.   ____ Follow recommendations from Cardiologist, Pulmonologist  or PCP regarding stopping Aspirin, Coumadin, Plavix ,Eliquis, Effient, or Pradaxa, and Pletal.  X____Stop Anti-inflammatories such as Advil, Aleve, IBUPROFEN, Motrin, Naproxen, Naprosyn, Goodies powders or aspirin products NOW-OK to take Tylenol OR PERCOCET IF NEEDED   ____ Stop supplements until after surgery.     ____ Bring C-Pap to the hospital.

## 2017-07-28 ENCOUNTER — Encounter
Admission: RE | Admit: 2017-07-28 | Discharge: 2017-07-28 | Disposition: A | Discharge status: Home / Self Care | Payer: 59 | Source: Ambulatory Visit | Attending: Orthopedic Surgery | Admitting: Orthopedic Surgery

## 2017-07-28 DIAGNOSIS — J301 Allergic rhinitis due to pollen: Secondary | ICD-10-CM | POA: Diagnosis not present

## 2017-07-28 DIAGNOSIS — N049 Nephrotic syndrome with unspecified morphologic changes: Secondary | ICD-10-CM | POA: Diagnosis not present

## 2017-07-28 DIAGNOSIS — Y9301 Activity, walking, marching and hiking: Secondary | ICD-10-CM | POA: Diagnosis not present

## 2017-07-28 DIAGNOSIS — S93431A Sprain of tibiofibular ligament of right ankle, initial encounter: Secondary | ICD-10-CM | POA: Diagnosis not present

## 2017-07-28 DIAGNOSIS — W010XXA Fall on same level from slipping, tripping and stumbling without subsequent striking against object, initial encounter: Secondary | ICD-10-CM | POA: Diagnosis not present

## 2017-07-28 DIAGNOSIS — S82851A Displaced trimalleolar fracture of right lower leg, initial encounter for closed fracture: Secondary | ICD-10-CM | POA: Diagnosis present

## 2017-07-28 DIAGNOSIS — E785 Hyperlipidemia, unspecified: Secondary | ICD-10-CM | POA: Diagnosis not present

## 2017-07-28 LAB — CBC
HCT: 48.6 % (ref 40.0–52.0)
HEMOGLOBIN: 16.3 g/dL (ref 13.0–18.0)
MCH: 31.3 pg (ref 26.0–34.0)
MCHC: 33.5 g/dL (ref 32.0–36.0)
MCV: 93.2 fL (ref 80.0–100.0)
PLATELETS: 394 10*3/uL (ref 150–440)
RBC: 5.22 MIL/uL (ref 4.40–5.90)
RDW: 14.3 % (ref 11.5–14.5)
WBC: 14.5 10*3/uL — AB (ref 3.8–10.6)

## 2017-07-28 LAB — BASIC METABOLIC PANEL
ANION GAP: 13 (ref 5–15)
BUN: 13 mg/dL (ref 6–20)
CALCIUM: 9.6 mg/dL (ref 8.9–10.3)
CO2: 24 mmol/L (ref 22–32)
CREATININE: 0.63 mg/dL (ref 0.61–1.24)
Chloride: 96 mmol/L — ABNORMAL LOW (ref 101–111)
GFR calc Af Amer: >60 mL/min (ref >60)
Glucose, Bld: 98 mg/dL (ref 65–99)
Potassium: 4.1 mmol/L (ref 3.5–5.1)
SODIUM: 133 mmol/L — AB (ref 135–145)

## 2017-07-28 NOTE — Pre-Procedure Instructions (Signed)
Progress Notes - in this encounter  Table of Contents for Progress Notes  Karlene LinemanStoeppler, Jennifer G -- 08/27/2014 12:18 PM EDT  Janyth PupaHegde, Akhil S, MD - 08/27/2014 11:35 AM EDT    Karlene LinemanStoeppler, Jennifer G - 08/27/2014 12:18 PM EDT A urine specimen was collected at the visit.  Back to top of Progress Notes Janyth PupaHegde, Akhil S, MD - 08/27/2014 11:35 AM EDT Formatting of this note may be different from the original. Referring Provider: Sharin MonsPegna, Guillaume J, MD   PCP: Sharin MonsGuillaume J Pegna, MD  Teaching Physician: Dr. Bonnell PublicNachman  08/27/2014  Chief Complaint: IgA Nephropathy  HPI: Mr. Alejandro PomfretRalfeil Fitzgerald Collier is a 32 y.o. 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 Per the patient he has been doing well recently. Had been treated for a flare with a steroid taper in 2013 as he was noted to have nephrotic range proteinuria (~5g) at that time. He has had issues with adherence to his medications, particularly his ace-inhibitor and prednisone. He has not taken his Lisinopril for the past 4 months as he had run out of the medication. Has not had any issues with lower extremity edema or stomach pain which are his typical symptoms during a flare. Otherwise he denies fevers, chills, headaches, vision changes, chest pain, shortness of breath, abdominal pain, nausea, vomiting, diarrhea, dysuria, hematuria, syncope or falls.   ROS: 11 systems reviewed and negative except those noted in the history of present illness  PAST MEDICAL HISTORY: Past Medical History  Diagnosis Date  . Hypertension  . IgA nephropathy   ALLERGIES Review of patient's allergies indicates no known allergies.  SOCIAL HISTORY History   Social History  . Marital Status: Single  Spouse Name: N/A  Number of Children: N/A  . Years of Education: N/A   Occupational History  . Not on file.   Social History Main Topics  .  Smoking status: Never Smoker  . Smokeless tobacco: Not on file  . Alcohol Use: Yes  Comment: occassional  . Drug Use: No  . Sexual Activity: Not on file   Other Topics Concern  . Not on file   Social History Narrative    FAMILY HISTORY No family history on file.   MEDICATIONS: Current Outpatient Prescriptions  Medication Sig Dispense Refill  . lisinopril (PRINIVIL,ZESTRIL) 10 MG tablet Take 0.5 tablets (5 mg total) by mouth daily. 30 tablet 3  . triamcinolone (KENALOG) 0.1 % ointment Apply topically two (2) times a day as needed. Apply twice a day as needed for 7 days, do not apply to the face or eyes. 15 g 0  . white petrolatum-mineral oil (EUCERIN) Crea Apply topically every six (6) hours as needed. 120 g 3   No current facility-administered medications for this visit.   PHYSICAL EXAM: Filed Vitals:  08/27/14 1104  BP: 108/88  Pulse: 80  Temp: 36.3 C   CONSTITUTIONAL: Thin, alert, no distress HEENT: Moist mucous membranes, oropharynx clear without erythema or exudate EYES: Extra ocular movements intact. Pupils reactive, sclerae anicteric. NECK: Supple, no lymphadenopathy CARDIOVASCULAR: Regular, 1/6 systolic murmur at the apex, ?S2 splitting, no rubs.  PULM: Clear to auscultation bilaterally GASTROINTESTINAL: Soft, active bowel sounds, nontender EXTREMITIES: No lower extremity edema bilaterally.  SKIN: No rashes or lesions NEUROLOGIC: No focal motor or sensory deficits PSYCH: alert and oriented x 3  MEDICAL DECISION MAKING  Results for orders placed or performed in visit  on 08/27/14  POCT urinalysis dipstick  Result Value Ref Range  Spec Gravity/POC <=1.005 1.003 - 1.030  PH/POC 5.5 5.0 - 9.0  Leuk Esterase/POC Negative NEGATIVE  Nitrite/POC Negative NEGATIVE  Protein/POC Trace NEGATIVE  UA Glucose/POC Negative NEGATIVE  Ketones, POC Negative NEGATIVE  Bilirubin/POC Negative NEGATIVE  Blood/POC Trace NEGATIVE  Urobilinogen/POC 0.2 0.2 TO 1 mg/dL  UA  LOCATION see below    CREATININE  Date Value Ref Range Status  11/23/2013 0.87 0.70 - 1.30 mg/dL Final  16/03/9603 5.40* 0.70 - 1.30 mg/dL Final  98/04/9146 8.29* 0.70 - 1.30 mg/dL Final  56/21/3086 5.78* 0.70 - 1.30 MG/DL Final  46/96/2952 8.41* 0.70 - 1.30 MG/DL Final    No results in the last day  Invalid input(s): C02, GLU  Urine Sediment: 2-3 monomorphic RBCs/HPF, no dysmorphic RBCs or casts noted  ASSESSMENT/PLAN: [name] is a 32 y.o. year old patient with a past medical history significant for IgA/MCD overlap syndrome and Hypertension who is being evaluated in clinic.   1. IgA/MCD overlap syndrome: Currently no overt symptoms of a flare. Blood pressure at goal, no lower extremity edema, urine sediment relatively quiet. Will re-start Lisinopril, UPC slightly higher than before but likely reflective of being off an ACE-inhibitor.  2. Hypertension: Stable, will resume Lisinopril at 5mg  for which I have sent in a prescription for.   Disposition: [name] will follow up in 6 month(s).   Patient was seen and evaluated by Dr. Bonnell Public who agrees with this assessment and plan.         Associated attestation - Roe Coombs, MD - 09/02/2014 4:23 PM EDT  ATTENDING: I saw and examined the patient with Dr Bryson Corona and discussed the pertinent findings and plan. I agree with the findings and the proposed plan.    Back to top of Progress Notes   Plan of Treatment - as of this encounter  Scheduled Tests Scheduled Tests  Name Priority Associated Diagnoses Order Schedule  Basic metabolic panel Routine IgA nephropathy  Expected: 08/27/2014 (Approximate), Expires: 08/27/2015  Albumin Routine IgA nephropathy  Expected: 08/27/2014 (Approximate), Expires: 08/27/2015  Protein/Creatinine Ratio, Urine Routine IgA nephropathy  Expected: 08/27/2014 (Approximate), Expires: 08/28/2015   Lab Results - in this encounter  Table of Contents for  Lab Results  Albumin (08/27/2014 12:52 PM)  Basic metabolic panel (08/27/2014 12:52 PM)  POCT urinalysis dipstick (08/27/2014 11:45 AM)  Protein/Creatinine Ratio, Urine (08/27/2014 11:30 AM)     Albumin (08/27/2014 12:52 PM) Albumin (08/27/2014 12:52 PM)  Component Value Ref Range  Albumin 4.6 3.5 - 5.0 g/dL   Albumin (32/44/0102 72:53 PM)  Specimen Performing Laboratory  Other [facility] CLINICAL LABORATORIES  [address]  [place], [place] [address]   Back to top of Lab Results    Basic metabolic panel (08/27/2014 12:52 PM) Basic metabolic panel (08/27/2014 12:52 PM)  Component Value Ref Range  Sodium 142 135 - 145 mmol/L  Potassium 5.0 3.5 - 5.0 mmol/L  Chloride 98 98 - 107 mmol/L  CO2 31 (H) 22 - 30 mmol/L  BUN 6 (L) 7 - 21 mg/dL  Creatinine 3.47 (L) 4.25 - 1.30 mg/dL  Glucose 78 65 - 956 mg/dL  Calcium 9.5 8.5 - 38.7 mg/dL   Basic metabolic panel (08/27/2014 12:52 PM)  Specimen Performing Laboratory  Other [facility] CLINICAL LABORATORIES  [address]  [place], [place] [address]   Back to top of Lab Results    POCT urinalysis dipstick (08/27/2014 11:45 AM) POCT  urinalysis dipstick (08/27/2014 11:45 AM)  Component Value Ref Range  Spec Gravity/POC <=1.005 1.003 - 1.030  PH/POC 5.5 5.0 - 9.0  Leuk Esterase/POC Negative NEGATIVE  Nitrite/POC Negative NEGATIVE  Protein/POC Trace NEGATIVE  UA Glucose/POC Negative NEGATIVE  Ketones, POC Negative NEGATIVE  Bilirubin/POC Negative NEGATIVE  Blood/POC Trace NEGATIVE  Urobilinogen/POC 0.2 0.2 TO 1 mg/dL  UA LOCATION see below Comment:  Performed by: [facility] [address] [place], [place] [address]     POCT urinalysis dipstick (08/27/2014 11:45 AM)  Specimen Performing Laboratory  Other [facility] CLINICAL LABORATORIES  [address]  [place], [place] [address]   Back to top of Lab Results    Protein/Creatinine Ratio, Urine (08/27/2014 11:30  AM) Protein/Creatinine Ratio, Urine (08/27/2014 11:30 AM)  Component Value Ref Range  Protein, Ur 12 Comment:  This test was developed and its performance characteristics determined by the [facility] of the [employer] [employer]. This test has not been cleared or approved by the FDA. The laboratory is regulated under CAP and CLIA as qualified to perform high-complexity testing. This test is to be used for clinical purposes and should not be regarded as investigational or for research. Results should be interpreted in context with other laboratory and clinical data.  VARIABLE mg/dL  Creatinine, Urine 09.8 VARIABLE mg/dL  Protein/Creatinine Ratio, Urine 0.517 UNDEFINED   Protein/Creatinine Ratio, Urine (08/27/2014 11:30 AM)  Specimen Performing Laboratory  Other [facility] CLINICAL LABORATORIES  [address]  [place], [place] [address]   Back to top of Lab Results   Visit Diagnoses   Diagnosis  IgA nephropathy - Primary  Nephritis and nephropathy, not specified as acute or chronic, with unspecified pathological lesion in kidney    Discontinued Medications - as of this encounter  Prescription Sig. Discontinue Reason Start Date End Date  lisinopril (PRINIVIL,ZESTRIL) 10 MG tablet  Take 0.5 tablets (5 mg total) by mouth daily. Reorder 08/04/2013 08/27/2014   Orders - in this encounter Nursing Count Last Ordered Date First Ordered Date  URINE DIPSTICK  08/27/2014    Images Document Information  Primary Care Provider Other Service Providers Document Coverage Dates  Sharin Mons MD (Jun. 23, 2015 - Present) [phone] (Work) [phone] (Fax) [address] [place], [place] [address]   Mar. 28, 2016   Orland Penman  [facility] [address] [place], [place] [address]   Encounter Providers Encounter Date  Marcha [place] MD (Attending) [phone] (Work) [phone] (Fax) [address]  [facility] [id number] [place], [place] [address]  Mar. 28, 2016

## 2017-07-29 ENCOUNTER — Encounter: Payer: Self-pay | Admitting: *Deleted

## 2017-07-29 MED ORDER — CEFAZOLIN SODIUM-DEXTROSE 2-4 GM/100ML-% IV SOLN
2.0000 g | Freq: Once | INTRAVENOUS | Status: AC
  Administered 2017-07-30: 2 g via INTRAVENOUS

## 2017-07-30 ENCOUNTER — Ambulatory Visit: Payer: 59 | Admitting: Anesthesiology

## 2017-07-30 ENCOUNTER — Encounter: Payer: Self-pay | Admitting: Emergency Medicine

## 2017-07-30 ENCOUNTER — Encounter
Admission: RE | Disposition: A | Discharge status: Home / Self Care | Payer: Self-pay | Source: Ambulatory Visit | Attending: Orthopedic Surgery

## 2017-07-30 ENCOUNTER — Ambulatory Visit
Admission: RE | Admit: 2017-07-30 | Discharge: 2017-07-30 | Disposition: A | Discharge status: Home / Self Care | Payer: 59 | Source: Ambulatory Visit | Attending: Orthopedic Surgery | Admitting: Orthopedic Surgery

## 2017-07-30 ENCOUNTER — Ambulatory Visit: Payer: 59

## 2017-07-30 DIAGNOSIS — S93431A Sprain of tibiofibular ligament of right ankle, initial encounter: Secondary | ICD-10-CM | POA: Insufficient documentation

## 2017-07-30 DIAGNOSIS — N049 Nephrotic syndrome with unspecified morphologic changes: Secondary | ICD-10-CM | POA: Insufficient documentation

## 2017-07-30 DIAGNOSIS — S82851A Displaced trimalleolar fracture of right lower leg, initial encounter for closed fracture: Secondary | ICD-10-CM | POA: Diagnosis not present

## 2017-07-30 DIAGNOSIS — Z419 Encounter for procedure for purposes other than remedying health state, unspecified: Secondary | ICD-10-CM

## 2017-07-30 DIAGNOSIS — E785 Hyperlipidemia, unspecified: Secondary | ICD-10-CM | POA: Insufficient documentation

## 2017-07-30 DIAGNOSIS — J301 Allergic rhinitis due to pollen: Secondary | ICD-10-CM | POA: Insufficient documentation

## 2017-07-30 DIAGNOSIS — Y9301 Activity, walking, marching and hiking: Secondary | ICD-10-CM | POA: Insufficient documentation

## 2017-07-30 DIAGNOSIS — W010XXA Fall on same level from slipping, tripping and stumbling without subsequent striking against object, initial encounter: Secondary | ICD-10-CM | POA: Insufficient documentation

## 2017-07-30 HISTORY — DX: Nonscarring hair loss, unspecified: L65.9

## 2017-07-30 HISTORY — PX: ORIF ANKLE FRACTURE: SHX5408

## 2017-07-30 HISTORY — PX: SYNDESMOSIS REPAIR: SHX5182

## 2017-07-30 LAB — URINE DRUG SCREEN, QUALITATIVE (ARMC ONLY)
Amphetamines, Ur Screen: NOT DETECTED
BENZODIAZEPINE, UR SCRN: NOT DETECTED
Barbiturates, Ur Screen: NOT DETECTED
CANNABINOID 50 NG, UR ~~LOC~~: NOT DETECTED
Cocaine Metabolite,Ur ~~LOC~~: NOT DETECTED
MDMA (ECSTASY) UR SCREEN: NOT DETECTED
Methadone Scn, Ur: NOT DETECTED
OPIATE, UR SCREEN: NOT DETECTED
PHENCYCLIDINE (PCP) UR S: NOT DETECTED
Tricyclic, Ur Screen: NOT DETECTED

## 2017-07-30 SURGERY — OPEN REDUCTION INTERNAL FIXATION (ORIF) ANKLE FRACTURE
Anesthesia: General | Laterality: Right

## 2017-07-30 MED ORDER — MIDAZOLAM HCL 2 MG/2ML IJ SOLN
INTRAMUSCULAR | Status: AC
  Administered 2017-07-30: 2 mg via INTRAVENOUS
  Filled 2017-07-30: qty 2

## 2017-07-30 MED ORDER — LIDOCAINE HCL (CARDIAC) 20 MG/ML IV SOLN
INTRAVENOUS | Status: DC | PRN
  Administered 2017-07-30: 100 mg via INTRAVENOUS

## 2017-07-30 MED ORDER — FENTANYL CITRATE (PF) 100 MCG/2ML IJ SOLN: 25.0000 ug | INTRAMUSCULAR | Status: DC | PRN

## 2017-07-30 MED ORDER — ACETAMINOPHEN 500 MG PO TABS: 1000.0000 mg | ORAL_TABLET | Freq: Three times a day (TID) | ORAL | 2 refills | Status: AC

## 2017-07-30 MED ORDER — SODIUM CHLORIDE 0.9 % IV SOLN
INTRAVENOUS | Status: DC
  Administered 2017-07-30: 07:00:00 via INTRAVENOUS

## 2017-07-30 MED ORDER — FENTANYL CITRATE (PF) 100 MCG/2ML IJ SOLN
INTRAMUSCULAR | Status: AC
  Administered 2017-07-30: 50 ug via INTRAVENOUS
  Filled 2017-07-30: qty 2

## 2017-07-30 MED ORDER — PROPOFOL 10 MG/ML IV BOLUS
INTRAVENOUS | Status: AC
Start: 2017-07-30 — End: 2017-07-30
  Filled 2017-07-30: qty 20

## 2017-07-30 MED ORDER — CEFAZOLIN SODIUM-DEXTROSE 2-4 GM/100ML-% IV SOLN
INTRAVENOUS | Status: AC
  Filled 2017-07-30: qty 100

## 2017-07-30 MED ORDER — FENTANYL CITRATE (PF) 100 MCG/2ML IJ SOLN
50.0000 ug | Freq: Once | INTRAMUSCULAR | Status: AC
  Administered 2017-07-30: 50 ug via INTRAVENOUS

## 2017-07-30 MED ORDER — ONDANSETRON HCL 4 MG/2ML IJ SOLN
INTRAMUSCULAR | Status: DC | PRN
  Administered 2017-07-30: 4 mg via INTRAVENOUS

## 2017-07-30 MED ORDER — ONDANSETRON 4 MG PO TBDP: 4.0000 mg | ORAL_TABLET | Freq: Three times a day (TID) | ORAL | 0 refills | Status: DC | PRN

## 2017-07-30 MED ORDER — LIDOCAINE HCL (PF) 1 % IJ SOLN
INTRAMUSCULAR | Status: DC | PRN
  Administered 2017-07-30: .8 mL via SUBCUTANEOUS

## 2017-07-30 MED ORDER — MIDAZOLAM HCL 2 MG/2ML IJ SOLN
INTRAMUSCULAR | Status: DC | PRN
  Administered 2017-07-30 (×2): 1 mg via INTRAVENOUS

## 2017-07-30 MED ORDER — LIDOCAINE HCL (PF) 2 % IJ SOLN
INTRAMUSCULAR | Status: AC
  Filled 2017-07-30: qty 10

## 2017-07-30 MED ORDER — DEXAMETHASONE SODIUM PHOSPHATE 10 MG/ML IJ SOLN
INTRAMUSCULAR | Status: DC | PRN
  Administered 2017-07-30: 10 mg via INTRAVENOUS

## 2017-07-30 MED ORDER — LIDOCAINE HCL (PF) 1 % IJ SOLN
INTRAMUSCULAR | Status: AC
  Filled 2017-07-30: qty 5

## 2017-07-30 MED ORDER — FENTANYL CITRATE (PF) 100 MCG/2ML IJ SOLN
INTRAMUSCULAR | Status: DC | PRN
  Administered 2017-07-30 (×2): 50 ug via INTRAVENOUS

## 2017-07-30 MED ORDER — FAMOTIDINE 20 MG PO TABS: 20.0000 mg | ORAL_TABLET | Freq: Once | ORAL | Status: DC

## 2017-07-30 MED ORDER — SODIUM CHLORIDE 0.9 % IV SOLN
INTRAVENOUS | Status: DC | PRN
  Administered 2017-07-30: 100 ug/min via INTRAVENOUS

## 2017-07-30 MED ORDER — ROCURONIUM BROMIDE 100 MG/10ML IV SOLN
INTRAVENOUS | Status: DC | PRN
  Administered 2017-07-30: 30 mg via INTRAVENOUS

## 2017-07-30 MED ORDER — ASPIRIN EC 325 MG PO TBEC: 325.0000 mg | DELAYED_RELEASE_TABLET | Freq: Every day | ORAL | 0 refills | Status: AC

## 2017-07-30 MED ORDER — ROPIVACAINE HCL 5 MG/ML IJ SOLN
INTRAMUSCULAR | Status: DC | PRN
  Administered 2017-07-30: 30 mL via PERINEURAL

## 2017-07-30 MED ORDER — BUPIVACAINE HCL (PF) 0.5 % IJ SOLN
INTRAMUSCULAR | Status: AC
  Filled 2017-07-30: qty 10

## 2017-07-30 MED ORDER — FENTANYL CITRATE (PF) 100 MCG/2ML IJ SOLN
INTRAMUSCULAR | Status: AC
  Filled 2017-07-30: qty 2

## 2017-07-30 MED ORDER — MIDAZOLAM HCL 2 MG/2ML IJ SOLN: 1.0000 mg | Freq: Once | INTRAMUSCULAR | Status: DC

## 2017-07-30 MED ORDER — PHENYLEPHRINE HCL 10 MG/ML IJ SOLN
INTRAMUSCULAR | Status: DC | PRN
  Administered 2017-07-30 (×2): 100 ug via INTRAVENOUS

## 2017-07-30 MED ORDER — MIDAZOLAM HCL 2 MG/2ML IJ SOLN
2.0000 mg | Freq: Once | INTRAMUSCULAR | Status: AC
  Administered 2017-07-30: 2 mg via INTRAVENOUS

## 2017-07-30 MED ORDER — OXYCODONE HCL 5 MG PO TABS: 5.0000 mg | ORAL_TABLET | ORAL | 0 refills | Status: AC | PRN

## 2017-07-30 MED ORDER — ROPIVACAINE HCL 5 MG/ML IJ SOLN
INTRAMUSCULAR | Status: AC
  Filled 2017-07-30: qty 30

## 2017-07-30 MED ORDER — FAMOTIDINE 20 MG PO TABS
ORAL_TABLET | ORAL | Status: AC
  Filled 2017-07-30: qty 1

## 2017-07-30 MED ORDER — EPINEPHRINE PF 1 MG/ML IJ SOLN
INTRAMUSCULAR | Status: AC
  Filled 2017-07-30: qty 1

## 2017-07-30 MED ORDER — MIDAZOLAM HCL 2 MG/2ML IJ SOLN
INTRAMUSCULAR | Status: AC
  Filled 2017-07-30: qty 2

## 2017-07-30 MED ORDER — ONDANSETRON HCL 4 MG/2ML IJ SOLN: 4.0000 mg | Freq: Once | INTRAMUSCULAR | Status: DC | PRN

## 2017-07-30 MED ORDER — BUPIVACAINE-EPINEPHRINE (PF) 0.25% -1:200000 IJ SOLN
INTRAMUSCULAR | Status: AC
  Filled 2017-07-30: qty 30

## 2017-07-30 MED ORDER — BUPIVACAINE-EPINEPHRINE (PF) 0.25% -1:200000 IJ SOLN
INTRAMUSCULAR | Status: DC | PRN
  Administered 2017-07-30: 6 mL via PERINEURAL

## 2017-07-30 MED ORDER — PROPOFOL 10 MG/ML IV BOLUS
INTRAVENOUS | Status: DC | PRN
  Administered 2017-07-30: 200 mg via INTRAVENOUS

## 2017-07-30 MED ORDER — BUPIVACAINE HCL (PF) 0.5 % IJ SOLN
INTRAMUSCULAR | Status: DC | PRN
  Administered 2017-07-30: 10 mL

## 2017-07-30 SURGICAL SUPPLY — 63 items
BANDAGE ACE 4X5 VEL STRL LF (GAUZE/BANDAGES/DRESSINGS) ×3 IMPLANT
BANDAGE ACE 6X5 VEL STRL LF (GAUZE/BANDAGES/DRESSINGS) ×3 IMPLANT
BIT DRILL 2.0 (BIT) ×3 IMPLANT
BLADE SURG 15 STRL LF DISP TIS (BLADE) ×2 IMPLANT
BLADE SURG 15 STRL SS (BLADE) ×4
BLADE SURG SZ10 CARB STEEL (BLADE) ×3 IMPLANT
BNDG COHESIVE 4X5 TAN STRL (GAUZE/BANDAGES/DRESSINGS) IMPLANT
BNDG ESMARK 6X12 TAN STRL LF (GAUZE/BANDAGES/DRESSINGS) ×3 IMPLANT
CANISTER SUCT 1200ML W/VALVE (MISCELLANEOUS) ×3 IMPLANT
CAST PADDING 6X4YD ST 30248 (SOFTGOODS) ×2
CHLORAPREP W/TINT 26ML (MISCELLANEOUS) ×3 IMPLANT
COVER LIGHT HANDLE STERIS (MISCELLANEOUS) ×3 IMPLANT
CUFF TOURN 30 STER DUAL PORT (MISCELLANEOUS) ×3 IMPLANT
DRAPE C-ARM XRAY 36X54 (DRAPES) ×3 IMPLANT
DRAPE C-ARMOR (DRAPES) ×3 IMPLANT
DRAPE U-SHAPE 47X51 STRL (DRAPES) ×3 IMPLANT
DRILL 2.6X122MM WL AO SHAFT (BIT) ×3 IMPLANT
DRILL OVER 2.7X220 (BIT) ×3 IMPLANT
ELECT CAUTERY BLADE 6.4 (BLADE) ×3 IMPLANT
ELECT REM PT RETURN 9FT ADLT (ELECTROSURGICAL) ×3
ELECTRODE REM PT RTRN 9FT ADLT (ELECTROSURGICAL) ×1 IMPLANT
GAUZE PETRO XEROFOAM 1X8 (MISCELLANEOUS) ×3 IMPLANT
GAUZE SPONGE 4X4 12PLY STRL (GAUZE/BANDAGES/DRESSINGS) ×3 IMPLANT
GAUZE XEROFORM 4X4 STRL (GAUZE/BANDAGES/DRESSINGS) ×3 IMPLANT
GLOVE BIOGEL PI IND STRL 8 (GLOVE) ×1 IMPLANT
GLOVE BIOGEL PI INDICATOR 8 (GLOVE) ×2
GLOVE SURG SYN 7.5  E (GLOVE) ×4
GLOVE SURG SYN 7.5 E (GLOVE) ×2 IMPLANT
GOWN STRL REUS W/ TWL LRG LVL3 (GOWN DISPOSABLE) ×1 IMPLANT
GOWN STRL REUS W/ TWL XL LVL3 (GOWN DISPOSABLE) ×1 IMPLANT
GOWN STRL REUS W/TWL LRG LVL3 (GOWN DISPOSABLE) ×2
GOWN STRL REUS W/TWL XL LVL3 (GOWN DISPOSABLE) ×2
HANDLE YANKAUER SUCT BULB TIP (MISCELLANEOUS) ×3 IMPLANT
K-WIRE 1.4X100 (WIRE) ×12
KIT TURNOVER KIT A (KITS) ×3 IMPLANT
KWIRE 1.4X100 (WIRE) ×4 IMPLANT
NS IRRIG 1000ML POUR BTL (IV SOLUTION) ×3 IMPLANT
PACK EXTREMITY ARMC (MISCELLANEOUS) ×3 IMPLANT
PAD CAST CTTN 4X4 STRL (SOFTGOODS) ×1 IMPLANT
PADDING CAST COTTON 4X4 STRL (SOFTGOODS) ×2
PADDING CAST COTTON 6X4 ST (SOFTGOODS) ×1 IMPLANT
PLATE FIBULA 4H (Plate) ×3 IMPLANT
SCREW BONE 14MMX3.5MM (Screw) ×3 IMPLANT
SCREW BONE 2.7X16MM (Screw) ×3 IMPLANT
SCREW BONE 2.7X18MM (Screw) ×3 IMPLANT
SCREW BONE 2.7X20MM (Screw) ×3 IMPLANT
SCREW BONE 3.5X16MM (Screw) ×3 IMPLANT
SCREW BONE ANKLE 3.5X14MM (Screw) ×3 IMPLANT
SCREW CORTICAL 2.7X14MM (Screw) ×6 IMPLANT
SCREW LOCKING 3.5X12 (Screw) ×3 IMPLANT
SCREW LOCKING 3.5X16MM (Screw) ×3 IMPLANT
SPLINT FAST PLASTER 5X30 (CAST SUPPLIES) ×2
SPLINT PLASTER CAST FAST 5X30 (CAST SUPPLIES) ×1 IMPLANT
SPONGE LAP 18X18 5 PK (GAUZE/BANDAGES/DRESSINGS) ×3 IMPLANT
STAPLER SKIN PROX 35W (STAPLE) ×3 IMPLANT
STOCKINETTE STRL 6IN 960660 (GAUZE/BANDAGES/DRESSINGS) ×3 IMPLANT
SUT ETHILON 3-0 FS-10 30 BLK (SUTURE) ×3
SUT VIC AB 0 CT2 27 (SUTURE) ×3 IMPLANT
SUT VIC AB 3-0 SH 27 (SUTURE) ×2
SUT VIC AB 3-0 SH 27X BRD (SUTURE) ×1 IMPLANT
SUTURE EHLN 3-0 FS-10 30 BLK (SUTURE) ×1 IMPLANT
SYNDESMOSIS TIGHTROPE XP (Orthopedic Implant) ×3 IMPLANT
TOWEL OR 17X26 4PK STRL BLUE (TOWEL DISPOSABLE) ×3 IMPLANT

## 2017-07-30 NOTE — Op Note (Addendum)
Operative Note   SURGERY DATE: 07/30/2017  PRE-OP DIAGNOSIS:  1. Unstable distal fibula fracture of R ankle   POST-OP DIAGNOSIS:  1. Trimalleolar equivalent fracture of R ankle 2. Rupture of R ankle syndesmosis  PROCEDURE(S): 1. ORIF trimalleolar ankle fracture without fixation of posterior fragment 2. R syndesmosis fixation  SURGEON: Rosealee AlbeeSunny H. Corin Tilly, MD   ANESTHESIA: Regional  + Gen  ESTIMATED BLOOD LOSS: 5cc  DRAINS:  None  TOTAL IV FLUIDS: see anesthesia record  IMPLANTS: Stryker VariAx distal fibular plate 2 - 9.6EA3.5mm Stryker cortical screws distally 3 - 3.845mm Stryker cortical screws proximally 3 - 2.927mm Stryker lag screws 1 - Arthrex Sydnesmosis TightRope  INDICATION(S): The patient is a 32 y.o. male who had a fall ~2 weeks ago and noted immediate ankle pain. Radiographs showed a lateral malleolus fracture with lateral displacement of the talus on stress views.  OPERATIVE FINDINGS: Trimalleolar equivalent fracture of R ankle (comminuted lateral malleolar fracture, small (<20% articular) posterior malleolar fragment, deltoid ligament injury, rupture of ankle syndesmosis  OPERATIVE REPORT:   The patient was seen in the Holding Room. The risks, benefits, complications, treatment options, and expected outcomes were discussed with the patient. The risks and potential complications of the problem and purposed treatment include but are not limited to infection, bleeding, pain, stiffness, nerve and vessel injury and complication secondary to the anesthetic. The patient concurred with the proposed plan, giving informed consent.  The site of surgery was properly noted/marked. A peripheral nerve block was administered by the Anesthesia team.  The patient was taken to Operating Room and transferred to the operating room table. A Time Out was held and the patient identity, procedure, and laterality was confirmed. After administration of adequate anesthesia, the entire lower  extremity was prescrubbed with Hibiclens and alcohol, prepped with Chloroprep, and draped in sterile fashion. The patient was given pre-operative IV antibiotics within 30 minutes of the skin incision.  The tourniquet was inflated to 250mmHg after exsanguinating the leg with an Esmarch bandage. A standard distal fibular incision was made with a 15 blade along the lateral aspect of the fibula. Dissection was carried down sharply to the fibula. The fracture site was identified and cleared of any tissue with a combination of dental pick, knife, and curette.  A pointed reduction clamp was placed and the primary fracture was reduced. Appropriate reduction was confirmed visually and fluoroscopically. The fracture line was long enough to accommodate two 2.857mm lag screws in an A-P fashion. The pointed reduction clamp was removed and the fracture remained anatomically reduced. There was a secondary fracture involving the anterior portion of the distal fibula. This was clamped in an appropriately reduced position and another A-P 2.347mm lag screw was placed. Clamp was removed. Fluoroscopy confirmed appropriate length and reduction of fibula.  A Stryker VariAx Distal Fibula plate was selected after confirming appropriate size and position on the posterolateral fibula fluoroscopically. Three locking screws were placed in the distal fragment. Then 2 additional cortical screws were placed in the proximal fragment. Fluoroscopy then confirmed appropriate hardware position and reduction. External rotation stress view was positive for widening.    Therefore, we turned our attention to they syndesmosis repair. Under fluoroscopy, a 3.157mm drill was used to drill across both the fibula and tibia, confirming appropriate position with fluoroscopy. A syndesmosis tightrope was passed through the far tibial cortex. The medial button was pulled back and flipped with confirmation with fluoroscopy. The button on the lateral side was  advanced, tightening the tightrope until  it was flush against the bone.  Once final tightening was achieved, we performed repeat external rotation stress test. It was now normal.   The wound was then thoroughly irrigated.  0 Vicryl sutures were used to close the deep layer over the plate. 3-0 Vicryl was used to close the subdermal layer. Staples were used to close the skin. The wound was dressed with xeroform, fluffs, and cotton guaze. Tourniquet was let down with a total time of 120 minutes. The leg was then placed in a short leg splint. The patient was awakened from anesthesia without any further complication and transferred to PACU for further recovery.    POST-OPERATIVE PLAN:  Patient will be discharged to home. The patient will be NWB for 6 weeks. ASA x 4 weeks for DVT ppx. Follow up in 2 weeks as an outpatient. Transition to walking boot at that time. Start PT after 2 week appointment.

## 2017-07-30 NOTE — Transfer of Care (Signed)
Immediate Anesthesia Transfer of Care Note  Patient: Alejandro Collier  Procedure(s) Performed: OPEN REDUCTION INTERNAL FIXATION (ORIF) ANKLE FRACTURE (Right ) POSSIBLE SYNDESMOSIS REPAIR (Right )  Patient Location: PACU  Anesthesia Type:General  Level of Consciousness: awake  Airway & Oxygen Therapy: Patient Spontanous Breathing  Post-op Assessment: Report given to RN  Post vital signs: stable  Last Vitals:  Vitals:   07/30/17 0849 07/30/17 0854  BP: 136/75 133/77  Pulse: 88 88  Resp: 16 18  Temp:    SpO2: 99% 97%    Last Pain:  Vitals:   07/30/17 0809  TempSrc:   PainSc: 5          Complications: No apparent anesthesia complications

## 2017-07-30 NOTE — H&P (Signed)
[place] to be scanned into permanent record. H&P reviewed. No significant changes noted.  After discussion of risks, benefits, and alternatives to surgery, the patient elected to proceed.   Lungs: chest sounds clear Heart: regular rate and rhythm

## 2017-07-30 NOTE — Discharge Instructions (Signed)
Ankle Fracture Surgery °  °Post-Op Instructions °  °1. Bracing or crutches: Crutches will be provided at the time of discharge. °  °2. Splint/Cast: You will have a splint (3/4 cast) on your leg after surgery. Ensure that this remains clean and dry until follow up appointment. If this becomes wet, you need to call our offices to get it changed or else you risk skin breakdown.   °  °3. Driving:  Plan on not driving for at least four to six weeks. Please note that you are advised NOT to drive while taking narcotic pain medications as you may be impaired and unsafe to drive. °  °4. Activity: Ankle pumps several times an hour while awake to prevent blood clots. Weight bearing: Non-weight bearing. Use crutches for at least 6 weeks, if not longer based on your surgery. Bending and straightening the knee is unlimited. Elevate knee above heart level as much as possible for one week. Avoid standing more than 5 minutes (consecutively) for the first week.  Avoid long distance travel for 4 weeks. °  °5. Medications:  °- You have been provided a prescription for narcotic pain medicine. After surgery, take 1-2 narcotic tablets every 4 hours if needed for severe pain. Please start this as soon as you begin to start having pain (if you received a nerve block, start taking as soon as this wears off).  °- A prescription for anti-nausea medication will be provided in case the narcotic medicine causes nausea - take 1 tablet every 6 hours only if nauseated.  °- Take enteric coated aspirin 325 mg once daily for 4 weeks to prevent blood clots.  °-Take tylenol 1000 mg every 8 hours for pain.  May stop tylenol 5 days after surgery if you are having minimal pain. °  °If you are taking prescription medication for anxiety, depression, insomnia, muscle spasm, chronic pain, or for attention deficit disorder you are advised that you are at a higher risk of adverse effects with use of narcotics post-op, including narcotic addiction/dependence,  depressed breathing, death. °If you use non-prescribed substances: alcohol, marijuana, cocaine, heroin, methamphetamines, etc., you are at a higher risk of adverse effects with use of narcotics post-op, including narcotic addiction/dependence, depressed breathing, death. °You are advised that taking > 50 morphine milligram equivalents (MME) of narcotic pain medication per day results in twice the risk of overdose or death. For your prescription provided: oxycodone 5 mg - taking more than 6 tablets per day. Be advised that we will prescribe narcotics short-term, for acute post-operative pain only - 1 week for minor operations such as knee arthroscopy for meniscus tear resection, and 3 weeks for major operations such as knee repair/reconstruction surgeries.  °  °6. Physical Therapy: Plan to start after follow up appointment at 2 weeks. 1-2 times per week for ~12-16 weeks. Therapy typically starts on post operative Day 3 or 4. You have been provided an order for physical therapy. The therapist will provide home exercises. Please contact our offices if this appointment has not been scheduled.  °  °7. Work/School: May return to full work when off of crutches. May do light duty/desk job or return to school in approximately 1-2 weeks when off of narcotics, pain is well-controlled, and swelling has decreased. °  °8. Post-Op Appointments: °Your first post-op appointment will be with Dr. Miller Edgington in approximately 2 weeks time.  °  °If you find that they have not been scheduled please call the Orthopaedic Appointment front desk at [phone]. ° ° ° °AMBULATORY SURGERY  °DISCHARGE INSTRUCTIONS ° ° °1) The   drugs that you were given will stay in your system until tomorrow so for the next 24 hours you should not: ° °A) Drive an automobile °B) Make any legal decisions °C) Drink any alcoholic beverage ° ° °2) You may resume regular meals tomorrow.  Today it is better to start with liquids and gradually work up to solid foods. ° °You  may eat anything you prefer, but it is better to start with liquids, then soup and crackers, and gradually work up to solid foods. ° ° °3) Please notify your doctor immediately if you have any unusual bleeding, trouble breathing, redness and pain at the surgery site, drainage, fever, or pain not relieved by medication. ° ° ° °4) Additional Instructions: ° ° ° ° ° ° ° °Please contact your physician with any problems or Same Day Surgery at [phone], Monday through Friday 6 am to 4 pm, or [place] [facility] [place] [facility] number at [phone]. ° ° ° ° ° °

## 2017-07-30 NOTE — Anesthesia Procedure Notes (Signed)
Anesthesia Regional Block:  Pre-Anesthetic Checklist: ,, timeout performed, Correct Patient, Correct Site, Correct Laterality, Correct Procedure, Correct Position, site marked, Risks and benefits discussed,  Surgical consent,  Pre-op evaluation,  At surgeon's request and post-op pain management  Laterality: Lower  Prep: chloraprep       Needles:  Injection technique: Single-shot  Needle Type: Echogenic Needle     Needle Length: 9cm  Needle Gauge: 21     Additional Needles:   Procedures:,,,, ultrasound used (permanent image in chart),,,,  Narrative:  Injection made incrementally with aspirations every 5 mL.  Performed by: Personally  Anesthesiologist: Piscitello, Cleda MccreedyJoseph K, MD  Additional Notes: Functioning IV was confirmed and monitors were applied.  A echogenic needle was used. Sterile prep,hand hygiene and sterile gloves were used. Minimal sedation used for procedure.   No paresthesia endorsed by patient during the procedure.  Negative aspiration and negative test dose prior to incremental administration of local anesthetic. The patient tolerated the procedure well with no immediate complications.

## 2017-07-30 NOTE — Anesthesia Preprocedure Evaluation (Signed)
Anesthesia Evaluation  Patient identified by MRN, date of birth, ID band Patient awake    Reviewed: Allergy & Precautions, NPO status , Patient's Chart, lab work & pertinent test results  History of Anesthesia Complications Negative for: history of anesthetic complications  Airway Mallampati: II       Dental   Pulmonary neg sleep apnea, neg COPD,           Cardiovascular hypertension, Pt. on medications (-) Past MI and (-) CHF (-) dysrhythmias (-) Valvular Problems/Murmurs     Neuro/Psych neg Seizures    GI/Hepatic Neg liver ROS, GERD (pt denies)  ,  Endo/Other  neg diabetes  Renal/GU Renal disease (nephrotic syndrome)     Musculoskeletal   Abdominal   Peds  Hematology   Anesthesia Other Findings   Reproductive/Obstetrics                             Anesthesia Physical Anesthesia Plan  ASA: II  Anesthesia Plan: General   Post-op Pain Management: GA combined w/ Regional for post-op pain   Induction: Intravenous  PONV Risk Score and Plan:   Airway Management Planned: LMA and Oral ETT  Additional Equipment:   Intra-op Plan:   Post-operative Plan:   Informed Consent: I have reviewed the patients History and Physical, chart, labs and discussed the procedure including the risks, benefits and alternatives for the proposed anesthesia with the patient or authorized representative who has indicated his/her understanding and acceptance.     Plan Discussed with:   Anesthesia Plan Comments:         Anesthesia Quick Evaluation

## 2017-07-30 NOTE — Anesthesia Procedure Notes (Signed)
Procedures

## 2017-07-30 NOTE — Anesthesia Postprocedure Evaluation (Signed)
Anesthesia Post Note  Patient: Alejandro Collier  Procedure(s) Performed: OPEN REDUCTION INTERNAL FIXATION (ORIF) ANKLE FRACTURE (Right ) POSSIBLE SYNDESMOSIS REPAIR (Right )  Patient location during evaluation: PACU Anesthesia Type: General Level of consciousness: awake and alert Pain management: pain level controlled Vital Signs Assessment: post-procedure vital signs reviewed and stable Respiratory status: spontaneous breathing and respiratory function stable Cardiovascular status: stable Anesthetic complications: no     Last Vitals:  Vitals:   07/30/17 1222 07/30/17 1234  BP: 125/82 (!) 141/80  Pulse: (!) 106 (!) 101  Resp: 17 18  Temp:  36.9 C  SpO2: 96% 97%    Last Pain:  Vitals:   07/30/17 0809  TempSrc:   PainSc: 5                  [place] Korver K

## 2017-07-30 NOTE — Anesthesia Post-op Follow-up Note (Signed)
Anesthesia QCDR form completed.        

## 2017-07-30 NOTE — Anesthesia Procedure Notes (Signed)
Anesthesia Regional Block: Popliteal block   Pre-Anesthetic Checklist: ,, timeout performed, Correct Patient, Correct Site, Correct Laterality, Correct Procedure, Correct Position, site marked, Risks and benefits discussed,  Surgical consent,  Pre-op evaluation,  At surgeon's request and post-op pain management  Laterality: Right  Prep: chloraprep       Needles:  Injection technique: Single-shot  Needle Type: Echogenic Stimulator Needle     Needle Length: 9cm  Needle Gauge: 21     Additional Needles:   Procedures:,,,, ultrasound used (permanent image in chart),,,,   Nerve Stimulator or Paresthesia:  Response: plantar flexion of foot, 0.8 mA,   Additional Responses:   Narrative:  Start time: 07/30/2017 8:26 AM End time: 07/30/2017 8:38 AM Injection made incrementally with aspirations every 5 mL.  Performed by: Personally  Anesthesiologist: Piscitello, Cleda MccreedyJoseph K, MD  Additional Notes: Functioning IV was confirmed and monitors were applied.  A echogenic needle was used. Sterile prep,was used. Minimal sedation used for procedure.   No paresthesia endorsed by patient during the procedure.  Negative aspiration and negative test dose prior to incremental administration of local anesthetic. The patient tolerated the procedure well with no immediate complications.

## 2017-08-03 ENCOUNTER — Encounter: Payer: Self-pay | Admitting: Orthopedic Surgery

## 2017-12-07 ENCOUNTER — Emergency Department
Admission: EM | Admit: 2017-12-07 | Discharge: 2017-12-07 | Disposition: A | Discharge status: Home / Self Care | Payer: 59 | Attending: Emergency Medicine | Admitting: Emergency Medicine

## 2017-12-07 ENCOUNTER — Encounter: Payer: Self-pay | Admitting: *Deleted

## 2017-12-07 ENCOUNTER — Emergency Department: Payer: 59

## 2017-12-07 ENCOUNTER — Other Ambulatory Visit: Payer: Self-pay

## 2017-12-07 DIAGNOSIS — R112 Nausea with vomiting, unspecified: Secondary | ICD-10-CM

## 2017-12-07 DIAGNOSIS — I10 Essential (primary) hypertension: Secondary | ICD-10-CM | POA: Diagnosis not present

## 2017-12-07 DIAGNOSIS — E860 Dehydration: Secondary | ICD-10-CM | POA: Diagnosis present

## 2017-12-07 DIAGNOSIS — K8020 Calculus of gallbladder without cholecystitis without obstruction: Secondary | ICD-10-CM | POA: Diagnosis not present

## 2017-12-07 LAB — COMPREHENSIVE METABOLIC PANEL
ALK PHOS: 94 U/L (ref 38–126)
ALT: 56 U/L — ABNORMAL HIGH (ref 0–44)
ANION GAP: 15 (ref 5–15)
AST: 82 U/L — ABNORMAL HIGH (ref 15–41)
Albumin: 4.6 g/dL (ref 3.5–5.0)
BUN: 12 mg/dL (ref 6–20)
CALCIUM: 8.7 mg/dL — AB (ref 8.9–10.3)
CO2: 27 mmol/L (ref 22–32)
Chloride: 95 mmol/L — ABNORMAL LOW (ref 98–111)
Creatinine, Ser: 0.52 mg/dL — ABNORMAL LOW (ref 0.61–1.24)
GFR calc Af Amer: >60 mL/min (ref >60)
GFR calc non Af Amer: >60 mL/min (ref >60)
Glucose, Bld: 112 mg/dL — ABNORMAL HIGH (ref 70–99)
Potassium: 3.3 mmol/L — ABNORMAL LOW (ref 3.5–5.1)
Sodium: 137 mmol/L (ref 135–145)
Total Bilirubin: 3.6 mg/dL — ABNORMAL HIGH (ref 0.3–1.2)
Total Protein: 8.6 g/dL — ABNORMAL HIGH (ref 6.5–8.1)

## 2017-12-07 LAB — URINALYSIS, COMPLETE (UACMP) WITH MICROSCOPIC
BACTERIA UA: NONE SEEN
Bilirubin Urine: NEGATIVE
Glucose, UA: NEGATIVE mg/dL
KETONES UR: 5 mg/dL — AB
Leukocytes, UA: NEGATIVE
Nitrite: NEGATIVE
Specific Gravity, Urine: 1.029 (ref 1.005–1.030)
pH: 7 (ref 5.0–8.0)

## 2017-12-07 LAB — CBC
HEMATOCRIT: 46.8 % (ref 40.0–52.0)
HEMOGLOBIN: 16.3 g/dL (ref 13.0–18.0)
MCH: 31.4 pg (ref 26.0–34.0)
MCHC: 34.8 g/dL (ref 32.0–36.0)
MCV: 90.4 fL (ref 80.0–100.0)
Platelets: 271 10*3/uL (ref 150–440)
RBC: 5.18 MIL/uL (ref 4.40–5.90)
RDW: 13.8 % (ref 11.5–14.5)
WBC: 14.4 10*3/uL — ABNORMAL HIGH (ref 3.8–10.6)

## 2017-12-07 LAB — ETHANOL: Alcohol, Ethyl (B): <10 mg/dL (ref <10)

## 2017-12-07 LAB — LIPASE, BLOOD: Lipase: 25 U/L (ref 11–51)

## 2017-12-07 MED ORDER — ONDANSETRON 4 MG PO TBDP: 4.0000 mg | ORAL_TABLET | Freq: Three times a day (TID) | ORAL | 0 refills | Status: DC | PRN

## 2017-12-07 MED ORDER — ONDANSETRON HCL 4 MG/2ML IJ SOLN
4.0000 mg | Freq: Once | INTRAMUSCULAR | Status: AC
  Administered 2017-12-07: 4 mg via INTRAVENOUS
  Filled 2017-12-07: qty 2

## 2017-12-07 MED ORDER — SODIUM CHLORIDE 0.9 % IV BOLUS
1000.0000 mL | Freq: Once | INTRAVENOUS | Status: AC
  Administered 2017-12-07: 1000 mL via INTRAVENOUS

## 2017-12-07 MED ORDER — OXYCODONE-ACETAMINOPHEN 5-325 MG PO TABS: 1.0000 | ORAL_TABLET | ORAL | 0 refills | Status: DC | PRN

## 2017-12-07 NOTE — ED Triage Notes (Addendum)
Pt reports feeling dehydrated.  Pt states vomiting x 4.  No diarrhea.  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  Pt denies pain.  Pt reports drinking etoh every day.  Blood pressure elevated.  No bp meds for 1 year.

## 2017-12-07 NOTE — ED Notes (Signed)
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
## 2017-12-07 NOTE — Discharge Instructions (Signed)
Please follow-up with surgery to have your gallbladder removed.  Please return with any worsening symptoms or any other concerns.

## 2017-12-27 ENCOUNTER — Ambulatory Visit: Payer: Self-pay | Admitting: Surgery

## 2019-08-28 ENCOUNTER — Emergency Department: Payer: Self-pay

## 2019-08-28 ENCOUNTER — Other Ambulatory Visit: Payer: Self-pay

## 2019-08-28 ENCOUNTER — Emergency Department
Admission: EM | Admit: 2019-08-28 | Discharge: 2019-08-28 | Disposition: A | Discharge status: Home / Self Care | Payer: Self-pay | Attending: Emergency Medicine | Admitting: Emergency Medicine

## 2019-08-28 ENCOUNTER — Encounter: Payer: Self-pay | Admitting: Emergency Medicine

## 2019-08-28 DIAGNOSIS — M79605 Pain in left leg: Secondary | ICD-10-CM | POA: Insufficient documentation

## 2019-08-28 DIAGNOSIS — I10 Essential (primary) hypertension: Secondary | ICD-10-CM | POA: Insufficient documentation

## 2019-08-28 DIAGNOSIS — M25562 Pain in left knee: Secondary | ICD-10-CM | POA: Insufficient documentation

## 2019-08-28 DIAGNOSIS — M109 Gout, unspecified: Secondary | ICD-10-CM | POA: Insufficient documentation

## 2019-08-28 DIAGNOSIS — Z79899 Other long term (current) drug therapy: Secondary | ICD-10-CM | POA: Insufficient documentation

## 2019-08-28 LAB — CBC WITH DIFFERENTIAL/PLATELET
Abs Immature Granulocytes: 0.08 10*3/uL — ABNORMAL HIGH (ref 0.00–0.07)
Basophils Absolute: 0.1 10*3/uL (ref 0.0–0.1)
Basophils Relative: 1 %
Eosinophils Absolute: 0 10*3/uL (ref 0.0–0.5)
Eosinophils Relative: 0 %
HCT: 40.1 % (ref 39.0–52.0)
Hemoglobin: 13.9 g/dL (ref 13.0–17.0)
Immature Granulocytes: 1 %
Lymphocytes Relative: 9 %
Lymphs Abs: 1.2 10*3/uL (ref 0.7–4.0)
MCH: 31.4 pg (ref 26.0–34.0)
MCHC: 34.7 g/dL (ref 30.0–36.0)
MCV: 90.7 fL (ref 80.0–100.0)
Monocytes Absolute: 2.2 10*3/uL — ABNORMAL HIGH (ref 0.1–1.0)
Monocytes Relative: 16 %
Neutro Abs: 9.9 10*3/uL — ABNORMAL HIGH (ref 1.7–7.7)
Neutrophils Relative %: 73 %
Platelets: 423 10*3/uL — ABNORMAL HIGH (ref 150–400)
RBC: 4.42 MIL/uL (ref 4.22–5.81)
RDW: 12.9 % (ref 11.5–15.5)
WBC: 13.5 10*3/uL — ABNORMAL HIGH (ref 4.0–10.5)
nRBC: 0 % (ref 0.0–0.2)

## 2019-08-28 LAB — COMPREHENSIVE METABOLIC PANEL
ALT: 69 U/L — ABNORMAL HIGH (ref 0–44)
AST: 60 U/L — ABNORMAL HIGH (ref 15–41)
Albumin: 3.9 g/dL (ref 3.5–5.0)
Alkaline Phosphatase: 97 U/L (ref 38–126)
Anion gap: 14 (ref 5–15)
BUN: 7 mg/dL (ref 6–20)
CO2: 24 mmol/L (ref 22–32)
Calcium: 8.3 mg/dL — ABNORMAL LOW (ref 8.9–10.3)
Chloride: 99 mmol/L (ref 98–111)
Creatinine, Ser: 0.61 mg/dL (ref 0.61–1.24)
GFR calc Af Amer: >60 mL/min (ref >60)
GFR calc non Af Amer: >60 mL/min (ref >60)
Glucose, Bld: 108 mg/dL — ABNORMAL HIGH (ref 70–99)
Potassium: 3.9 mmol/L (ref 3.5–5.1)
Sodium: 137 mmol/L (ref 135–145)
Total Bilirubin: 1.6 mg/dL — ABNORMAL HIGH (ref 0.3–1.2)
Total Protein: 7.6 g/dL (ref 6.5–8.1)

## 2019-08-28 LAB — URINALYSIS, COMPLETE (UACMP) WITH MICROSCOPIC
Bacteria, UA: NONE SEEN
Bilirubin Urine: NEGATIVE
Glucose, UA: NEGATIVE mg/dL
Ketones, ur: NEGATIVE mg/dL
Nitrite: NEGATIVE
Protein, ur: >=300 mg/dL — AB
Specific Gravity, Urine: 1.024 (ref 1.005–1.030)
Squamous Epithelial / LPF: NONE SEEN (ref 0–5)
pH: 5 (ref 5.0–8.0)

## 2019-08-28 LAB — URIC ACID: Uric Acid, Serum: 9.8 mg/dL — ABNORMAL HIGH (ref 3.7–8.6)

## 2019-08-28 MED ORDER — NAPROXEN 500 MG PO TABS: 500.0000 mg | ORAL_TABLET | Freq: Two times a day (BID) | ORAL | 0 refills | Status: AC

## 2019-08-28 MED ORDER — SULFAMETHOXAZOLE-TRIMETHOPRIM 800-160 MG PO TABS: 1.0000 | ORAL_TABLET | Freq: Two times a day (BID) | ORAL | 0 refills | Status: DC

## 2019-08-28 MED ORDER — PREDNISONE 50 MG PO TABS: ORAL_TABLET | ORAL | 0 refills | Status: DC

## 2019-08-28 MED ORDER — SULFAMETHOXAZOLE-TRIMETHOPRIM 800-160 MG PO TABS
1.0000 | ORAL_TABLET | Freq: Once | ORAL | Status: AC
  Administered 2019-08-28: 16:00:00 1 via ORAL
  Filled 2019-08-28: qty 1

## 2019-08-28 MED ORDER — NAPROXEN 500 MG PO TABS
500.0000 mg | ORAL_TABLET | Freq: Once | ORAL | Status: AC
  Administered 2019-08-28: 500 mg via ORAL
  Filled 2019-08-28: qty 1

## 2019-08-28 MED ORDER — PREDNISONE 20 MG PO TABS
60.0000 mg | ORAL_TABLET | Freq: Once | ORAL | Status: AC
  Administered 2019-08-28: 60 mg via ORAL
  Filled 2019-08-28: qty 3

## 2019-08-28 NOTE — ED Notes (Signed)
Pt discharged home after verbalizing understanding of discharge instructions; nad noted. 
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
## 2019-08-28 NOTE — Discharge Instructions (Addendum)
I suspect that you have gout in your knee.  Your uric acid level was up, which is usually elevated during a gout flare.  Please begin prednisone tomorrow for this.  You can begin 10 Rowson tonight.  I am also going to give you antibiotics to cover for any possible bacterial infection.  You can begin Bactrim before you go to bed tonight.  Please follow-up with primary care.  If symptoms are worsening, return to the emergency department.

## 2019-08-28 NOTE — ED Triage Notes (Signed)
Presents with pain and swelling to left knee and thigh  Denies ay injury  States pain became worse this am  Ambulates with limp d/p pain

## 2020-03-07 ENCOUNTER — Other Ambulatory Visit: Payer: Self-pay

## 2020-03-07 ENCOUNTER — Encounter: Payer: Self-pay | Admitting: Emergency Medicine

## 2020-03-07 DIAGNOSIS — I10 Essential (primary) hypertension: Secondary | ICD-10-CM | POA: Insufficient documentation

## 2020-03-07 DIAGNOSIS — E860 Dehydration: Secondary | ICD-10-CM | POA: Insufficient documentation

## 2020-03-07 LAB — CBC
HCT: 41.7 % (ref 39.0–52.0)
Hemoglobin: 14.5 g/dL (ref 13.0–17.0)
MCH: 31.6 pg (ref 26.0–34.0)
MCHC: 34.8 g/dL (ref 30.0–36.0)
MCV: 90.8 fL (ref 80.0–100.0)
Platelets: 467 10*3/uL — ABNORMAL HIGH (ref 150–400)
RBC: 4.59 MIL/uL (ref 4.22–5.81)
RDW: 12.8 % (ref 11.5–15.5)
WBC: 10.2 10*3/uL (ref 4.0–10.5)
nRBC: 0 % (ref 0.0–0.2)

## 2020-03-07 LAB — BASIC METABOLIC PANEL
Anion gap: 15 (ref 5–15)
BUN: 7 mg/dL (ref 6–20)
CO2: 25 mmol/L (ref 22–32)
Calcium: 9.1 mg/dL (ref 8.9–10.3)
Chloride: 99 mmol/L (ref 98–111)
Creatinine, Ser: 0.77 mg/dL (ref 0.61–1.24)
GFR calc non Af Amer: >60 mL/min (ref >60)
Glucose, Bld: 100 mg/dL — ABNORMAL HIGH (ref 70–99)
Potassium: 3.4 mmol/L — ABNORMAL LOW (ref 3.5–5.1)
Sodium: 139 mmol/L (ref 135–145)

## 2020-03-07 LAB — TROPONIN I (HIGH SENSITIVITY): Troponin I (High Sensitivity): 11 ng/L (ref <18)

## 2020-03-07 NOTE — ED Triage Notes (Signed)
Pt to ED via EMS c/o dizziness today, one other episode in the past couple weeks, denies pain or n/v/d.  Denies LOC or hitting head, denies medical hx.  Pt A&Ox4, chest rise even and unlabored, skin WNL and in NAD at this time.  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

## 2020-03-07 NOTE — ED Triage Notes (Signed)
EMS brought pt in from [place] in [place] for c/o nausea and dizziness

## 2020-03-07 NOTE — ED Notes (Signed)
Four tubes drawn

## 2020-03-08 ENCOUNTER — Emergency Department: Payer: Self-pay

## 2020-03-08 ENCOUNTER — Emergency Department
Admission: EM | Admit: 2020-03-08 | Discharge: 2020-03-08 | Disposition: A | Discharge status: Home / Self Care | Payer: Self-pay | Attending: Emergency Medicine | Admitting: Emergency Medicine

## 2020-03-08 DIAGNOSIS — E860 Dehydration: Secondary | ICD-10-CM

## 2020-03-08 DIAGNOSIS — R42 Dizziness and giddiness: Secondary | ICD-10-CM

## 2020-03-08 LAB — URINALYSIS, COMPLETE (UACMP) WITH MICROSCOPIC
Bacteria, UA: NONE SEEN
Bilirubin Urine: NEGATIVE
Glucose, UA: NEGATIVE mg/dL
Hgb urine dipstick: NEGATIVE
Ketones, ur: NEGATIVE mg/dL
Leukocytes,Ua: NEGATIVE
Nitrite: NEGATIVE
Protein, ur: 100 mg/dL — AB
Specific Gravity, Urine: 1.014 (ref 1.005–1.030)
Squamous Epithelial / HPF: NONE SEEN (ref 0–5)
pH: 6 (ref 5.0–8.0)

## 2020-03-08 LAB — TROPONIN I (HIGH SENSITIVITY): Troponin I (High Sensitivity): 12 ng/L (ref <18)

## 2020-03-08 MED ORDER — CLONIDINE HCL 0.1 MG PO TABS
0.1000 mg | ORAL_TABLET | Freq: Once | ORAL | Status: DC
  Filled 2020-03-08: qty 1
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
## 2020-03-08 NOTE — ED Notes (Signed)
Pt denies dizziness at this time.

## 2020-10-05 ENCOUNTER — Emergency Department: Payer: Self-pay

## 2020-10-05 ENCOUNTER — Emergency Department
Admission: EM | Admit: 2020-10-05 | Discharge: 2020-10-05 | Disposition: A | Discharge status: Home / Self Care | Payer: Self-pay | Attending: Emergency Medicine | Admitting: Emergency Medicine

## 2020-10-05 DIAGNOSIS — R519 Headache, unspecified: Secondary | ICD-10-CM | POA: Diagnosis not present

## 2020-10-05 DIAGNOSIS — S0592XA Unspecified injury of left eye and orbit, initial encounter: Secondary | ICD-10-CM | POA: Diagnosis present

## 2020-10-05 DIAGNOSIS — I10 Essential (primary) hypertension: Secondary | ICD-10-CM | POA: Insufficient documentation

## 2020-10-05 DIAGNOSIS — S0083XA Contusion of other part of head, initial encounter: Secondary | ICD-10-CM

## 2020-10-05 DIAGNOSIS — S01112A Laceration without foreign body of left eyelid and periocular area, initial encounter: Secondary | ICD-10-CM | POA: Diagnosis not present

## 2020-10-05 MED ORDER — LIDOCAINE-EPINEPHRINE 2 %-1:100000 IJ SOLN
20.0000 mL | Freq: Once | INTRAMUSCULAR | Status: AC
  Administered 2020-10-05: 20 mL via INTRADERMAL
  Filled 2020-10-05: qty 1

## 2020-10-05 NOTE — ED Notes (Signed)
ED Provider at bedside. 
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
## 2020-10-05 NOTE — ED Notes (Signed)
Attempted to ask triage questions. Patient shut eyes and refused to answer this RN. Patient then preceded to call someone and yell over phone.  This RN informed patient that triage will be completed when he is ready to answer questions.
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
## 2021-11-11 ENCOUNTER — Emergency Department
Admission: EM | Admit: 2021-11-11 | Discharge: 2021-11-11 | Disposition: A | Discharge status: Home / Self Care | Payer: Self-pay | Attending: Physician Assistant | Admitting: Physician Assistant

## 2021-11-11 ENCOUNTER — Other Ambulatory Visit: Payer: Self-pay

## 2021-11-11 ENCOUNTER — Encounter: Payer: Self-pay | Admitting: Physician Assistant

## 2021-11-11 DIAGNOSIS — S8392XA Sprain of unspecified site of left knee, initial encounter: Secondary | ICD-10-CM | POA: Insufficient documentation

## 2021-11-11 DIAGNOSIS — X501XXA Overexertion from prolonged static or awkward postures, initial encounter: Secondary | ICD-10-CM | POA: Insufficient documentation

## 2021-11-11 DIAGNOSIS — I10 Essential (primary) hypertension: Secondary | ICD-10-CM | POA: Insufficient documentation

## 2021-11-11 DIAGNOSIS — M25462 Effusion, left knee: Secondary | ICD-10-CM | POA: Insufficient documentation

## 2021-11-11 DIAGNOSIS — M25562 Pain in left knee: Secondary | ICD-10-CM

## 2021-11-11 MED ORDER — CYCLOBENZAPRINE HCL 5 MG PO TABS: 5.0000 mg | ORAL_TABLET | Freq: Three times a day (TID) | ORAL | 0 refills | Status: DC | PRN

## 2021-11-11 MED ORDER — LISINOPRIL 10 MG PO TABS: 5.0000 mg | ORAL_TABLET | Freq: Every day | ORAL | 5 refills | Status: DC

## 2021-11-11 MED ORDER — LISINOPRIL 5 MG PO TABS: 5.0000 mg | ORAL_TABLET | Freq: Every day | ORAL | 11 refills | Status: DC

## 2021-11-11 MED ORDER — LISINOPRIL 10 MG PO TABS: 10.0000 mg | ORAL_TABLET | Freq: Every day | ORAL | 11 refills | Status: DC

## 2021-11-11 MED ORDER — LIDOCAINE HCL (PF) 1 % IJ SOLN
5.0000 mL | Freq: Once | INTRAMUSCULAR | Status: AC
  Administered 2021-11-11: 5 mL
  Filled 2021-11-11: qty 5

## 2021-11-11 NOTE — ED Triage Notes (Signed)
Pt here with left knee pain and swelling that started yesterday. Pt denies injury. Pt still able to ambulate with a limp on affected knee.

## 2021-11-11 NOTE — Discharge Instructions (Addendum)
Rest with the leg elevated. Take the prescription muscle relaxant (after work) as needed for pain. Take your BP medicine daily as directed. Follow-up with Ortho as needed. Select a local community clinic as discussed for routine medical care.

## 2021-11-11 NOTE — ED Provider Notes (Cosign Needed)
[facility] Emergency Department Provider Note     None    (approximate)   History   Knee Pain   HPI  Alejandro Collier is a 36 y.o. male with history of GERD, alopecia, renal disorder, hypertension and gout, presents to the ED with acute left knee pain.  Patient reports pain that started yesterday along with tightness and swelling.  He denies any remote injury but did play basketball 2 days prior to onset.  He does recall getting off the couch yesterday, and having a twisting injury to the knee, causing the knee joint to pop.  He denies any outright fall, trauma, abrasion, or contusion.  Patient also denies any fevers, chills, or sweats. Male presents prior  Physical Exam   Triage Vital Signs: ED Triage Vitals  Enc Vitals Group     BP 11/11/21 1453 (!) 153/108     Pulse Rate 11/11/21 1452 85     Resp 11/11/21 1452 16     Temp 11/11/21 1452 98.2 F (36.8 C)     Temp Source 11/11/21 1452 Oral     SpO2 11/11/21 1452 97 %     Weight 11/11/21 1452 152 lb (68.9 kg)     Height 11/11/21 1452 5\' 10"  (1.778 m)     Head Circumference --      Peak Flow --      Pain Score 11/11/21 1452 7     Pain Loc --      Pain Edu? --      Excl. in GC? --     Most recent vital signs: Vitals:   11/11/21 1452 11/11/21 1453  BP:  (!) 153/108  Pulse: 85   Resp: 16   Temp: 98.2 F (36.8 C)   SpO2: 97%     General Awake, no distress.  CV:  Good peripheral perfusion.  RESP:  Normal effort.  ABD:  No distention.  MSK:  Left knee with moderate joint effusion appreciated.  The knee joint is tight but patient is able demonstrate active range of motion.  No overlying erythema, warmth, or induration appreciated.  No skin lesions or abrasions are noted.   ED Results / Procedures / Treatments   Labs (all labs ordered are listed, but only abnormal results are displayed) Labs Reviewed - No data to display   EKG    RADIOLOGY   ED Provider Interpretation: }  No  results found.   PROCEDURES:  Critical Care performed: No  .Joint Aspiration/Arthrocentesis  Date/Time: 11/11/2021 4:18 PM  Performed by: 11/13/2021, PA-C Authorized by: Lissa Hoard, PA-C   Consent:    Consent obtained:  Verbal   Consent given by:  Patient   Risks, benefits, and alternatives were discussed: yes     Risks discussed:  Pain, infection, incomplete drainage, nerve damage and bleeding   Alternatives discussed:  Alternative treatment Universal protocol:    Site/side marked: yes     Immediately prior to procedure, a time out was called: yes     Patient identity confirmed:  Verbally with patient Location:    Location:  Knee   Knee:  L knee Anesthesia:    Anesthesia method:  Local infiltration   Local anesthetic:  Lidocaine 1% w/o epi Procedure details:    Preparation: Patient was prepped and draped in usual sterile fashion     Needle gauge:  18 G   Ultrasound guidance: no     Approach:  Medial  Aspirate amount:  50 cc   Aspirate characteristics:  Bloody and serous   Steroid injected: no   Post-procedure details:    Dressing:  Gauze roll and sterile dressing   Procedure completion:  Tolerated well, no immediate complications    MEDICATIONS ORDERED IN ED: Medications  lidocaine (PF) (XYLOCAINE) 1 % injection 5 mL (5 mLs Infiltration Given 11/11/21 1721)     IMPRESSION / MDM / ASSESSMENT AND PLAN / ED COURSE  I reviewed the triage vital signs and the nursing notes.                              Differential diagnosis includes, but is not limited to, septic joint, gout flare, knee sprain, joint effusion  Patient's presentation is most consistent with acute complicated illness / injury requiring diagnostic workup.  Patient to the ED for evaluation of acute left knee pain and swelling.  Patient's diagnosis is consistent with a joint effusion status post sprain.  Patient consented to, and had a joint arthrocentesis performed.  The  result was evidence of a hemarthrosis.  Patient will be discharged home with prescriptions for Flexeril and a courtesy prescription for his lisinopril. Patient is to follow up with Ortho as well as a local primary provider as needed or otherwise directed. Patient is given ED precautions to return to the ED for any worsening or new symptoms.   FINAL CLINICAL IMPRESSION(S) / ED DIAGNOSES   Final diagnoses:  Effusion of left knee  Acute pain of left knee  Sprain of left knee, unspecified ligament, initial encounter     Rx / DC Orders   ED Discharge Orders          Ordered    lisinopril (ZESTRIL) 10 MG tablet  Daily,   Status:  Discontinued        11/11/21 1652    lisinopril (ZESTRIL) 5 MG tablet  Daily        11/11/21 1655    lisinopril (ZESTRIL) 10 MG tablet  Daily,   Status:  Discontinued        11/11/21 1655    cyclobenzaprine (FLEXERIL) 5 MG tablet  3 times daily PRN        11/11/21 1657             Note:  This document was prepared using Dragon voice recognition software and may include unintentional dictation errors.     Lissa Hoard, PA-C 11/11/21 1727

## 2023-06-22 ENCOUNTER — Ambulatory Visit: Payer: Self-pay

## 2023-07-26 ENCOUNTER — Emergency Department: Payer: Self-pay

## 2023-07-26 ENCOUNTER — Emergency Department
Admission: EM | Admit: 2023-07-26 | Discharge: 2023-07-26 | Disposition: A | Discharge status: Home / Self Care | Payer: Self-pay | Attending: Emergency Medicine | Admitting: Emergency Medicine

## 2023-07-26 ENCOUNTER — Other Ambulatory Visit: Payer: Self-pay

## 2023-07-26 DIAGNOSIS — F10930 Alcohol use, unspecified with withdrawal, uncomplicated: Secondary | ICD-10-CM

## 2023-07-26 DIAGNOSIS — W11XXXA Fall on and from ladder, initial encounter: Secondary | ICD-10-CM | POA: Insufficient documentation

## 2023-07-26 DIAGNOSIS — F10230 Alcohol dependence with withdrawal, uncomplicated: Secondary | ICD-10-CM | POA: Insufficient documentation

## 2023-07-26 DIAGNOSIS — R569 Unspecified convulsions: Secondary | ICD-10-CM

## 2023-07-26 DIAGNOSIS — S0101XA Laceration without foreign body of scalp, initial encounter: Secondary | ICD-10-CM

## 2023-07-26 DIAGNOSIS — S0003XA Contusion of scalp, initial encounter: Secondary | ICD-10-CM

## 2023-07-26 LAB — COMPREHENSIVE METABOLIC PANEL
ALT: 30 U/L (ref 0–44)
AST: 55 U/L — ABNORMAL HIGH (ref 15–41)
Albumin: 3.7 g/dL (ref 3.5–5.0)
Alkaline Phosphatase: 118 U/L (ref 38–126)
Anion gap: 19 — ABNORMAL HIGH (ref 5–15)
BUN: 10 mg/dL (ref 6–20)
CO2: 20 mmol/L — ABNORMAL LOW (ref 22–32)
Calcium: 8.7 mg/dL — ABNORMAL LOW (ref 8.9–10.3)
Chloride: 95 mmol/L — ABNORMAL LOW (ref 98–111)
Creatinine, Ser: 0.71 mg/dL (ref 0.61–1.24)
GFR, Estimated: >60 mL/min (ref >60)
Glucose, Bld: 114 mg/dL — ABNORMAL HIGH (ref 70–99)
Potassium: 3.4 mmol/L — ABNORMAL LOW (ref 3.5–5.1)
Sodium: 134 mmol/L — ABNORMAL LOW (ref 135–145)
Total Bilirubin: 2.3 mg/dL — ABNORMAL HIGH (ref 0.0–1.2)
Total Protein: 8.3 g/dL — ABNORMAL HIGH (ref 6.5–8.1)

## 2023-07-26 LAB — URINE DRUG SCREEN, QUALITATIVE (ARMC ONLY)
Amphetamines, Ur Screen: NOT DETECTED
Barbiturates, Ur Screen: NOT DETECTED
Benzodiazepine, Ur Scrn: NOT DETECTED
Cannabinoid 50 Ng, Ur ~~LOC~~: NOT DETECTED
Cocaine Metabolite,Ur ~~LOC~~: NOT DETECTED
MDMA (Ecstasy)Ur Screen: NOT DETECTED
Methadone Scn, Ur: NOT DETECTED
Opiate, Ur Screen: NOT DETECTED
Phencyclidine (PCP) Ur S: NOT DETECTED
Tricyclic, Ur Screen: NOT DETECTED

## 2023-07-26 LAB — URINALYSIS, ROUTINE W REFLEX MICROSCOPIC
Bilirubin Urine: NEGATIVE
Glucose, UA: NEGATIVE mg/dL
Ketones, ur: NEGATIVE mg/dL
Nitrite: NEGATIVE
Protein, ur: >=300 mg/dL — AB
Specific Gravity, Urine: 1.013 (ref 1.005–1.030)
Squamous Epithelial / HPF: 0 /[HPF] (ref 0–5)
WBC, UA: >50 WBC/hpf (ref 0–5)
pH: 7 (ref 5.0–8.0)

## 2023-07-26 LAB — CBC WITH DIFFERENTIAL/PLATELET
Abs Immature Granulocytes: 0.14 10*3/uL — ABNORMAL HIGH (ref 0.00–0.07)
Basophils Absolute: 0.2 10*3/uL — ABNORMAL HIGH (ref 0.0–0.1)
Basophils Relative: 1 %
Eosinophils Absolute: 0.1 10*3/uL (ref 0.0–0.5)
Eosinophils Relative: 0 %
HCT: 40.8 % (ref 39.0–52.0)
Hemoglobin: 14.3 g/dL (ref 13.0–17.0)
Immature Granulocytes: 1 %
Lymphocytes Relative: 6 %
Lymphs Abs: 1 10*3/uL (ref 0.7–4.0)
MCH: 32.4 pg (ref 26.0–34.0)
MCHC: 35 g/dL (ref 30.0–36.0)
MCV: 92.5 fL (ref 80.0–100.0)
Monocytes Absolute: 1.3 10*3/uL — ABNORMAL HIGH (ref 0.1–1.0)
Monocytes Relative: 8 %
Neutro Abs: 14.6 10*3/uL — ABNORMAL HIGH (ref 1.7–7.7)
Neutrophils Relative %: 84 %
Platelets: 189 10*3/uL (ref 150–400)
RBC: 4.41 MIL/uL (ref 4.22–5.81)
RDW: 13.5 % (ref 11.5–15.5)
WBC: 17.2 10*3/uL — ABNORMAL HIGH (ref 4.0–10.5)
nRBC: 0 % (ref 0.0–0.2)

## 2023-07-26 LAB — TROPONIN I (HIGH SENSITIVITY)
Troponin I (High Sensitivity): 17 ng/L (ref <18)
Troponin I (High Sensitivity): 21 ng/L — ABNORMAL HIGH (ref <18)

## 2023-07-26 LAB — ETHANOL: Alcohol, Ethyl (B): <10 mg/dL (ref <10)

## 2023-07-26 LAB — LACTIC ACID, PLASMA
Lactic Acid, Venous: 6.1 mmol/L (ref 0.5–1.9)
Lactic Acid, Venous: 1.3 mmol/L (ref 0.5–1.9)

## 2023-07-26 LAB — CK: Total CK: 165 U/L (ref 49–397)

## 2023-07-26 MED ORDER — LORAZEPAM 2 MG/ML IJ SOLN
1.0000 mg | Freq: Once | INTRAMUSCULAR | Status: AC
  Administered 2023-07-26: 1 mg via INTRAVENOUS
  Filled 2023-07-26: qty 1

## 2023-07-26 MED ORDER — SODIUM CHLORIDE 0.9 % IV BOLUS
1000.0000 mL | Freq: Once | INTRAVENOUS | Status: AC
  Administered 2023-07-26: 1000 mL via INTRAVENOUS

## 2023-07-26 MED ORDER — PROMETHAZINE HCL 25 MG/ML IJ SOLN: 12.5000 mg | Freq: Four times a day (QID) | INTRAMUSCULAR | Status: DC | PRN

## 2023-07-26 NOTE — ED Notes (Signed)
 Family at bedside.

## 2023-07-26 NOTE — ED Notes (Signed)
 Pt transported to CT ?

## 2023-07-26 NOTE — ED Provider Notes (Signed)
 [facility] Provider Note   Event Date/Time   First MD Initiated Contact with Patient 07/26/23 1801     (approximate) History  Loss of Consciousness  HPI Alejandro Collier is a 38 y.o. male with a past medical history of alcohol abuse who presents after a witnessed seizure at work when she fell off a ladder of unknown height.  Patient states that he did decrease alcohol intake recently.  Patient denies ever having an alcohol withdrawal seizure in the past.  Patient states that he does not wish to quit drinking alcohol at this time.  Patient denies any previous history of seizures in general. ROS: Patient currently denies any vision changes, tinnitus, difficulty speaking, facial droop, sore throat, chest pain, shortness of breath, abdominal pain, nausea/vomiting/diarrhea, dysuria, or weakness/numbness/paresthesias in any extremity   Physical Exam  Triage Vital Signs: ED Triage Vitals  Encounter Vitals Group     BP      Systolic BP Percentile      Diastolic BP Percentile      Pulse      Resp      Temp      Temp src      SpO2      Weight      Height      Head Circumference      Peak Flow      Pain Score      Pain Loc      Pain Education      Exclude from Growth Chart    Most recent vital signs: Vitals:   07/26/23 2135 07/26/23 2137  BP:    Pulse: (!) 101   Resp: 19   Temp:  97.9 F (36.6 C)  SpO2: 97%    General: Awake, oriented x4. CV:  Good peripheral perfusion.  Resp:  Normal effort.  Abd:  No distention.  Other:  Middle-aged well-developed, well-nourished African-American male resting comfortably in no acute distress.  2 cm laceration to the right temporal scalp ED Results / Procedures / Treatments  Labs (all labs ordered are listed, but only abnormal results are displayed) Labs Reviewed  COMPREHENSIVE METABOLIC PANEL - Abnormal; Notable for the following components:      Result Value   Sodium 134 (*)    Potassium 3.4 (*)    Chloride 95  (*)    CO2 20 (*)    Glucose, Bld 114 (*)    Calcium 8.7 (*)    Total Protein 8.3 (*)    AST 55 (*)    Total Bilirubin 2.3 (*)    Anion gap 19 (*)    All other components within normal limits  LACTIC ACID, PLASMA - Abnormal; Notable for the following components:   Lactic Acid, Venous 6.1 (*)    All other components within normal limits  CBC WITH DIFFERENTIAL/PLATELET - Abnormal; Notable for the following components:   WBC 17.2 (*)    Neutro Abs 14.6 (*)    Monocytes Absolute 1.3 (*)    Basophils Absolute 0.2 (*)    Abs Immature Granulocytes 0.14 (*)    All other components within normal limits  URINALYSIS, ROUTINE W REFLEX MICROSCOPIC - Abnormal; Notable for the following components:   Color, Urine YELLOW (*)    APPearance HAZY (*)    Hgb urine dipstick SMALL (*)    Protein, ur >=300 (*)    Leukocytes,Ua TRACE (*)    Bacteria, UA RARE (*)    All other components within normal  limits  TROPONIN I (HIGH SENSITIVITY) - Abnormal; Notable for the following components:   Troponin I (High Sensitivity) 21 (*)    All other components within normal limits  ETHANOL  LACTIC ACID, PLASMA  CK  URINE DRUG SCREEN, QUALITATIVE ([facility] ONLY)  TROPONIN I (HIGH SENSITIVITY)   EKG ED ECG REPORT I, Merwyn Katos, the attending physician, personally viewed and interpreted this ECG. Date: 07/26/2023 EKG Time: 1802 Rate: 113 Rhythm: Tachycardic sinus rhythm QRS Axis: normal Intervals: normal ST/T Wave abnormalities: normal Narrative Interpretation: Tachycardic sinus rhythm.  No evidence of acute ischemia RADIOLOGY ED MD interpretation: CT of the cervical spine interpreted by me does not show any evidence of acute abnormalities including no acute fracture, malalignment, height loss, or dislocation  CT of the head without contrast interpreted by me shows no evidence of acute abnormalities including no intracerebral hemorrhage, obvious masses, or significant edema -Agree with radiology  assessment Official radiology report(s): CT Cervical Spine Wo Contrast Result Date: 07/26/2023 CLINICAL DATA:  Seizures. EXAM: CT CERVICAL SPINE WITHOUT CONTRAST TECHNIQUE: Multidetector CT imaging of the cervical spine was performed without intravenous contrast. Multiplanar CT image reconstructions were also generated. RADIATION DOSE REDUCTION: This exam was performed according to the departmental dose-optimization program which includes automated exposure control, adjustment of the mA and/or kV according to patient size and/or use of iterative reconstruction technique. COMPARISON:  Oct 05, 2020 FINDINGS: Alignment: There is mild reversal of the normal cervical spine lordosis. Skull base and vertebrae: No acute fracture. No primary bone lesion or focal pathologic process. Soft tissues and spinal canal: No prevertebral fluid or swelling. No visible canal hematoma. Disc levels: Moderate severity anterior osteophyte formation is seen at the level of C5-C6. Mild intervertebral disc space narrowing is also noted at this level. Normal endplates and normal intervertebral disc spaces are present throughout the remainder of the cervical spine. Normal, bilateral multilevel facet joints are seen. Upper chest: Mild biapical scarring and/or atelectatic changes are noted. Other: None. IMPRESSION: 1. No acute fracture or subluxation in the cervical spine. 2. Moderate severity degenerative changes at the level of C5-C6. Electronically Signed   By: Aram Candela M.D.   On: 07/26/2023 19:38   CT Head Wo Contrast Result Date: 07/26/2023 CLINICAL DATA:  Seizure. EXAM: CT HEAD WITHOUT CONTRAST TECHNIQUE: Contiguous axial images were obtained from the base of the skull through the vertex without intravenous contrast. RADIATION DOSE REDUCTION: This exam was performed according to the departmental dose-optimization program which includes automated exposure control, adjustment of the mA and/or kV according to patient size and/or  use of iterative reconstruction technique. COMPARISON:  None Available. FINDINGS: Brain: No evidence of acute infarction, hemorrhage, hydrocephalus, extra-axial collection or mass lesion/mass effect. Vascular: No hyperdense vessel or unexpected calcification. Skull: Normal. Negative for fracture or focal lesion. Sinuses/Orbits: Mild to moderate severity bilateral ethmoid sinus mucosal thickening is noted. Other: Mild to moderate severity right frontoparietal scalp soft tissue swelling is seen. IMPRESSION: 1. No acute intracranial abnormality. 2. Mild to moderate severity right frontoparietal scalp soft tissue swelling. 3. Mild to moderate severity bilateral ethmoid sinus disease. Electronically Signed   By: Aram Candela M.D.   On: 07/26/2023 19:36   PROCEDURES: Critical Care performed: No Procedures MEDICATIONS ORDERED IN ED: Medications  sodium chloride 0.9 % bolus 1,000 mL (0 mLs Intravenous Stopped 07/26/23 1948)  LORazepam (ATIVAN) injection 1 mg (1 mg Intravenous Given 07/26/23 2123)   IMPRESSION / MDM / ASSESSMENT AND PLAN / ED COURSE  I reviewed the  triage vital signs and the nursing notes.                             The patient is on the cardiac monitor to evaluate for evidence of arrhythmia and/or significant heart rate changes. Patient's presentation is most consistent with acute presentation with potential threat to life or bodily function. Patient presents after recent seizure episode.  Patient had slow return to baseline mental and physical function per EMS. No immunosuppresion hx and had no preceding fever. Unlikely stroke, syncope. Unlikely infectious etiology. No preceding trauma.  Workup: EKG, BMP, POCT glucose (pregnancy test if male) and CT Brain.  Field Interventions: None ED Interventions: 1 mg Ativan Given patient's significant history of alcohol abuse I suspect this is a seizure related to alcohol withdrawal.  Patient was offered admission as well as  counseling and resources for alcohol withdrawal however patient wishes to be discharged home at this time.  Patient is at baseline and shows no signs of continued seizure activity however he does have signs of alcohol withdrawal including tachycardia and muscle tremors.  The symptoms improved after 1 mg Ativan and patient again requests to be discharged. Patient was encouraged to stop drinking as well as encouraged to use resources that were given for alcohol detoxification Disposition: Discharge home with primary care follow up in next 24-48 hours.   FINAL CLINICAL IMPRESSION(S) / ED DIAGNOSES   Final diagnoses:  Alcohol withdrawal seizure without complication (HCC)  Contusion of scalp, initial encounter  Laceration of scalp, initial encounter   Rx / DC Orders   ED Discharge Orders          Ordered    Ambulatory Referral to Primary Care (Establish Care)        07/26/23 2125           Note:  This document was prepared using Dragon voice recognition software and may include unintentional dictation errors.   Merwyn Katos, MD 07/26/23 [phone]

## 2023-07-26 NOTE — ED Notes (Signed)
 Pt given a clean pillow case, small bandage applied over laceration to head until provider is ready for wound repair. Bleeding is controlled at this time.

## 2023-07-26 NOTE — ED Triage Notes (Signed)
 Pt to ED via [facility] from work. EMS reports pt was on fork lift at ground level and had LOC falling out of fork lift and hitting head on concrete. Staff reports possible seizure like activity. No hx of seizures. EMS reports A&OX4 on arrival. EMS had pt in c-collar on arrival. Pt denies pain. Pt has laceration and swelling to right posterior head. Pt does not remember what happened. Pt denies etoh or drug use.

## 2024-03-23 ENCOUNTER — Encounter: Payer: Self-pay | Admitting: Emergency Medicine

## 2024-03-23 ENCOUNTER — Other Ambulatory Visit: Payer: Self-pay

## 2024-03-23 ENCOUNTER — Emergency Department: Payer: Self-pay

## 2024-03-23 ENCOUNTER — Emergency Department
Admission: EM | Admit: 2024-03-23 | Discharge: 2024-03-23 | Disposition: A | Discharge status: Home / Self Care | Payer: Self-pay | Attending: Emergency Medicine | Admitting: Emergency Medicine

## 2024-03-23 DIAGNOSIS — R319 Hematuria, unspecified: Secondary | ICD-10-CM

## 2024-03-23 DIAGNOSIS — R300 Dysuria: Secondary | ICD-10-CM

## 2024-03-23 DIAGNOSIS — N390 Urinary tract infection, site not specified: Secondary | ICD-10-CM | POA: Insufficient documentation

## 2024-03-23 DIAGNOSIS — I10 Essential (primary) hypertension: Secondary | ICD-10-CM | POA: Insufficient documentation

## 2024-03-23 LAB — URINALYSIS, ROUTINE W REFLEX MICROSCOPIC
Bacteria, UA: NONE SEEN
RBC / HPF: 0 RBC/hpf (ref 0–5)
RBC / HPF: >50 RBC/hpf (ref 0–5)
Squamous Epithelial / HPF: 0 /HPF (ref 0–5)
WBC, UA: >50 WBC/hpf (ref 0–5)

## 2024-03-23 LAB — BASIC METABOLIC PANEL WITH GFR
Anion gap: 11 (ref 5–15)
BUN: 7 mg/dL (ref 6–20)
CO2: 32 mmol/L (ref 22–32)
Calcium: 8.5 mg/dL — ABNORMAL LOW (ref 8.9–10.3)
Chloride: 99 mmol/L (ref 98–111)
Creatinine, Ser: 0.51 mg/dL — ABNORMAL LOW (ref 0.61–1.24)
GFR, Estimated: >60 mL/min (ref >60)
Glucose, Bld: 91 mg/dL (ref 70–99)
Potassium: 3.5 mmol/L (ref 3.5–5.1)
Sodium: 142 mmol/L (ref 135–145)

## 2024-03-23 LAB — CBC WITH DIFFERENTIAL/PLATELET
Abs Immature Granulocytes: 0.03 K/uL (ref 0.00–0.07)
Basophils Absolute: 0.1 K/uL (ref 0.0–0.1)
Basophils Relative: 1 %
Eosinophils Absolute: 0.3 K/uL (ref 0.0–0.5)
Eosinophils Relative: 3 %
HCT: 38.1 % — ABNORMAL LOW (ref 39.0–52.0)
Hemoglobin: 12.2 g/dL — ABNORMAL LOW (ref 13.0–17.0)
Immature Granulocytes: 0 %
Lymphocytes Relative: 18 %
Lymphs Abs: 1.5 K/uL (ref 0.7–4.0)
MCH: 26.1 pg (ref 26.0–34.0)
MCHC: 32 g/dL (ref 30.0–36.0)
MCV: 81.4 fL (ref 80.0–100.0)
Monocytes Absolute: 0.9 K/uL (ref 0.1–1.0)
Monocytes Relative: 11 %
Neutro Abs: 5.5 K/uL (ref 1.7–7.7)
Neutrophils Relative %: 67 %
Platelets: 157 K/uL (ref 150–400)
RBC: 4.68 MIL/uL (ref 4.22–5.81)
RDW: 17.4 % — ABNORMAL HIGH (ref 11.5–15.5)
WBC: 8.3 K/uL (ref 4.0–10.5)
nRBC: 0 % (ref 0.0–0.2)

## 2024-03-23 MED ORDER — SULFAMETHOXAZOLE-TRIMETHOPRIM 800-160 MG PO TABS
1.0000 | ORAL_TABLET | Freq: Once | ORAL | Status: AC
  Administered 2024-03-23: 1 via ORAL
  Filled 2024-03-23: qty 1

## 2024-03-23 MED ORDER — SULFAMETHOXAZOLE-TRIMETHOPRIM 800-160 MG PO TABS: 1.0000 | ORAL_TABLET | Freq: Two times a day (BID) | ORAL | 0 refills | Status: AC

## 2024-03-23 NOTE — ED Triage Notes (Signed)
 Pt via POV from home. Pt c/o blood in his urine and painful urination. States this happen a couple of weeks ago and he tried home remedies that helped but this time it has not helped. Pt states symptoms been going on a week. Pt is A&OX4 and NAD, ambulatory to triage.

## 2024-03-23 NOTE — ED Provider Notes (Signed)
 Alejandro Collier Provider Note    Event Date/Time   First MD Initiated Contact with Patient 03/23/24 1339     (approximate)   History   Penile Discharge and Dysuria   HPI  Alejandro Collier is a 38 y.o. male with history of GERD, hypertension, alcohol use presenting with dysuria and hematuria.  States that happened several weeks ago, tried home remedies without improvement.  He denies any abdominal pain or diarrhea, no penile discharge or rash.  No prior history of STIs, no fever or back pain.  No prior history of kidney stones.  Denies any trauma or falls, no testicular or scrotal pain or penile pain.  Dates that he had chlamydia many many years ago but has never had hematuria.   On independent chart review, he was seen previously for alcohol withdrawal but had declined admission at this time.  He states his last drink was yesterday, does not feel tremulous at this time.     Physical Exam   Triage Vital Signs: ED Triage Vitals  Encounter Vitals Group     BP 03/23/24 1243 (!) 170/109     Girls Systolic BP Percentile --      Girls Diastolic BP Percentile --      Boys Systolic BP Percentile --      Boys Diastolic BP Percentile --      Pulse Rate 03/23/24 1243 84     Resp 03/23/24 1243 18     Temp 03/23/24 1243 98 F (36.7 C)     Temp Source 03/23/24 1243 Oral     SpO2 03/23/24 1243 97 %     Weight 03/23/24 1239 150 lb (68 kg)     Height 03/23/24 1239 5' 10 (1.778 m)     Head Circumference --      Peak Flow --      Pain Score --      Pain Loc --      Pain Education --      Exclude from Growth Chart --     Most recent vital signs: Vitals:   03/23/24 1243  BP: (!) 170/109  Pulse: 84  Resp: 18  Temp: 98 F (36.7 C)  SpO2: 97%     General: Awake, no distress.  CV:  Good peripheral perfusion.  Resp:  Normal effort.  Abd:  No distention.  Soft nontender Other:  No flank or CVA tenderness, no tremors.   ED Results / Procedures / Treatments    Labs (all labs ordered are listed, but only abnormal results are displayed) Labs Reviewed  CBC WITH DIFFERENTIAL/PLATELET - Abnormal; Notable for the following components:      Result Value   Hemoglobin 12.2 (*)    HCT 38.1 (*)    RDW 17.4 (*)    All other components within normal limits  BASIC METABOLIC PANEL WITH GFR - Abnormal; Notable for the following components:   Creatinine, Ser 0.51 (*)    Calcium 8.5 (*)    All other components within normal limits  URINALYSIS, ROUTINE W REFLEX MICROSCOPIC - Abnormal; Notable for the following components:   Color, Urine RED (*)    APPearance TURBID (*)    Glucose, UA   (*)    Value: TEST NOT REPORTED DUE TO COLOR INTERFERENCE OF URINE PIGMENT   Hgb urine dipstick   (*)    Value: TEST NOT REPORTED DUE TO COLOR INTERFERENCE OF URINE PIGMENT   Bilirubin Urine   (*)  Value: TEST NOT REPORTED DUE TO COLOR INTERFERENCE OF URINE PIGMENT   Ketones, ur   (*)    Value: TEST NOT REPORTED DUE TO COLOR INTERFERENCE OF URINE PIGMENT   Protein, ur   (*)    Value: TEST NOT REPORTED DUE TO COLOR INTERFERENCE OF URINE PIGMENT   Nitrite   (*)    Value: TEST NOT REPORTED DUE TO COLOR INTERFERENCE OF URINE PIGMENT   Leukocytes,Ua   (*)    Value: TEST NOT REPORTED DUE TO COLOR INTERFERENCE OF URINE PIGMENT   Bacteria, UA RARE (*)    All other components within normal limits  CHLAMYDIA/NGC RT PCR ([facility] ONLY)            URINALYSIS, ROUTINE W REFLEX MICROSCOPIC       RADIOLOGY On my independent interpretation, CT without obvious hydronephrosis   PROCEDURES:  Critical Care performed: No  Procedures   MEDICATIONS ORDERED IN ED: Medications  sulfamethoxazole -trimethoprim  (BACTRIM  DS) 800-160 MG per tablet 1 tablet (has no administration in time range)     IMPRESSION / MDM / ASSESSMENT AND PLAN / ED COURSE  I reviewed the triage vital signs and the nursing notes.                              Differential diagnosis includes, but is not  limited to, UTI, kidney stones, possible mass or malignancy, anemia.  He has no penile discharge or rash to suggest STI.  No trauma to his GU region or pain.  Will get labs, CT abdomen pelvis, UA, GC chlamydia was sent at triage.  Patient's presentation is most consistent with acute presentation with potential threat to life or bodily function.  Independent interpretation of labs and imaging below.  Labs are reassuring, UA is consistent with UTI.  No prior range urine culture to compare, will give him a dose of Bactrim  here and send him home with prescription for antibiotics.  Will also put in a referral for him for primary care, will also give him a number to call for urology in case his hematuria does not resolved.  Considered but no indication for inpatient admission at this time, he safe for outpatient management.  Will discharge with strict return precautions.  Shared decision making done with patient and he is agreeable with this plan.    Clinical Course as of 03/23/24 1610  Thu Mar 23, 2024  1528 CT ABDOMEN PELVIS WO CONTRAST IMPRESSION: 1. No acute findings in the abdomen/pelvis. No renal/ureteral stones or obstruction. 2. Cholelithiasis. 3. 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 4. Subtle opacification over the posterior left lower lobe likely dependent atelectasis although early infection is possible.   [TT]  1605 Patient has has no cough or shortness of breath or chest pain, suspect CT imaging findings are most consistent with atelectasis. [TT]  1606 Independent review of labs, no leukocytosis, electrolytes not severely deranged, creatinine is not elevated, UA shows rare bacteria and many WBCs as well as RBCs.  Given his dysuria, will treat him for UTI.  First dose given in the emergency department. [TT]    Clinical Course User Index [TT] Alejandro Lorelle Cummins, MD     FINAL CLINICAL  IMPRESSION(S) / ED DIAGNOSES   Final diagnoses:  Dysuria  Urinary tract infection with hematuria, site unspecified  Hematuria, unspecified type     Rx /  DC Orders   ED Discharge Orders          Ordered    Ambulatory Referral to Primary Care (Establish Care)        03/23/24 1607    sulfamethoxazole -trimethoprim  (BACTRIM  DS) 800-160 MG tablet  2 times daily        03/23/24 1608             Note:  This document was prepared using Dragon voice recognition software and may include unintentional dictation errors.    Alejandro Lorelle Cummins, MD 03/23/24 [phone]

## 2024-03-23 NOTE — ED Notes (Signed)
 See triage note  Presents with some blood in urine with some dysuria  Sxs' started about a week ago  Afebrile on arrival

## 2024-03-23 NOTE — Discharge Instructions (Signed)
 Please make sure to take your antibiotics as prescribed for UTI.  If you still have persistent blood in your urine after you have completed the antibiotics, please follow-up with urology for further management.  I have also put in the referral for you for primary care, I have also put in a number for you to call for urology to follow-up.

## 2024-06-19 ENCOUNTER — Encounter: Payer: Self-pay | Admitting: Emergency Medicine

## 2024-06-19 ENCOUNTER — Inpatient Hospital Stay: Admission: EM | Admit: 2024-06-19 | Payer: Self-pay | Source: Home / Self Care

## 2024-06-19 ENCOUNTER — Other Ambulatory Visit: Payer: Self-pay

## 2024-06-19 DIAGNOSIS — Z79899 Other long term (current) drug therapy: Secondary | ICD-10-CM

## 2024-06-19 DIAGNOSIS — K219 Gastro-esophageal reflux disease without esophagitis: Secondary | ICD-10-CM | POA: Diagnosis present

## 2024-06-19 DIAGNOSIS — Y990 Civilian activity done for income or pay: Secondary | ICD-10-CM

## 2024-06-19 DIAGNOSIS — Z9081 Acquired absence of spleen: Secondary | ICD-10-CM

## 2024-06-19 DIAGNOSIS — J9601 Acute respiratory failure with hypoxia: Secondary | ICD-10-CM | POA: Diagnosis present

## 2024-06-19 DIAGNOSIS — F10231 Alcohol dependence with withdrawal delirium: Principal | ICD-10-CM | POA: Diagnosis present

## 2024-06-19 DIAGNOSIS — I4510 Unspecified right bundle-branch block: Secondary | ICD-10-CM | POA: Diagnosis present

## 2024-06-19 DIAGNOSIS — D6959 Other secondary thrombocytopenia: Secondary | ICD-10-CM | POA: Diagnosis present

## 2024-06-19 DIAGNOSIS — K760 Fatty (change of) liver, not elsewhere classified: Secondary | ICD-10-CM | POA: Diagnosis present

## 2024-06-19 DIAGNOSIS — R7401 Elevation of levels of liver transaminase levels: Secondary | ICD-10-CM

## 2024-06-19 DIAGNOSIS — E876 Hypokalemia: Secondary | ICD-10-CM | POA: Diagnosis present

## 2024-06-19 DIAGNOSIS — D696 Thrombocytopenia, unspecified: Secondary | ICD-10-CM

## 2024-06-19 DIAGNOSIS — K8020 Calculus of gallbladder without cholecystitis without obstruction: Secondary | ICD-10-CM | POA: Diagnosis present

## 2024-06-19 DIAGNOSIS — D72829 Elevated white blood cell count, unspecified: Secondary | ICD-10-CM

## 2024-06-19 DIAGNOSIS — Z781 Physical restraint status: Secondary | ICD-10-CM

## 2024-06-19 DIAGNOSIS — R011 Cardiac murmur, unspecified: Secondary | ICD-10-CM | POA: Diagnosis present

## 2024-06-19 DIAGNOSIS — F10939 Alcohol use, unspecified with withdrawal, unspecified: Secondary | ICD-10-CM

## 2024-06-19 DIAGNOSIS — J15211 Pneumonia due to Methicillin susceptible Staphylococcus aureus: Secondary | ICD-10-CM | POA: Diagnosis present

## 2024-06-19 DIAGNOSIS — L659 Nonscarring hair loss, unspecified: Secondary | ICD-10-CM | POA: Diagnosis present

## 2024-06-19 DIAGNOSIS — I10 Essential (primary) hypertension: Secondary | ICD-10-CM | POA: Diagnosis present

## 2024-06-19 DIAGNOSIS — D75839 Thrombocytosis, unspecified: Secondary | ICD-10-CM | POA: Insufficient documentation

## 2024-06-19 DIAGNOSIS — R531 Weakness: Principal | ICD-10-CM

## 2024-06-19 DIAGNOSIS — R569 Unspecified convulsions: Principal | ICD-10-CM | POA: Diagnosis present

## 2024-06-19 DIAGNOSIS — E8729 Other acidosis: Secondary | ICD-10-CM | POA: Diagnosis present

## 2024-06-19 DIAGNOSIS — J690 Pneumonitis due to inhalation of food and vomit: Secondary | ICD-10-CM | POA: Diagnosis present

## 2024-06-19 DIAGNOSIS — E878 Other disorders of electrolyte and fluid balance, not elsewhere classified: Secondary | ICD-10-CM | POA: Diagnosis present

## 2024-06-19 DIAGNOSIS — D649 Anemia, unspecified: Secondary | ICD-10-CM | POA: Diagnosis present

## 2024-06-19 DIAGNOSIS — G928 Other toxic encephalopathy: Secondary | ICD-10-CM | POA: Diagnosis present

## 2024-06-19 DIAGNOSIS — R7881 Bacteremia: Secondary | ICD-10-CM | POA: Diagnosis present

## 2024-06-19 DIAGNOSIS — K709 Alcoholic liver disease, unspecified: Secondary | ICD-10-CM | POA: Diagnosis present

## 2024-06-19 DIAGNOSIS — R456 Violent behavior: Secondary | ICD-10-CM | POA: Diagnosis present

## 2024-06-19 DIAGNOSIS — R7989 Other specified abnormal findings of blood chemistry: Secondary | ICD-10-CM | POA: Diagnosis present

## 2024-06-19 DIAGNOSIS — G3184 Mild cognitive impairment, so stated: Secondary | ICD-10-CM | POA: Diagnosis present

## 2024-06-19 DIAGNOSIS — Y900 Blood alcohol level of less than 20 mg/100 ml: Secondary | ICD-10-CM | POA: Diagnosis present

## 2024-06-19 LAB — BASIC METABOLIC PANEL WITH GFR
Anion gap: 26 — ABNORMAL HIGH (ref 5–15)
BUN: 7 mg/dL (ref 6–20)
CO2: 20 mmol/L — ABNORMAL LOW (ref 22–32)
Calcium: 8.9 mg/dL (ref 8.9–10.3)
Chloride: 93 mmol/L — ABNORMAL LOW (ref 98–111)
Creatinine, Ser: 0.8 mg/dL (ref 0.61–1.24)
GFR, Estimated: >60 mL/min
Glucose, Bld: 126 mg/dL — ABNORMAL HIGH (ref 70–99)
Potassium: 3.6 mmol/L (ref 3.5–5.1)
Sodium: 138 mmol/L (ref 135–145)

## 2024-06-19 LAB — CBC WITH DIFFERENTIAL/PLATELET
Abs Immature Granulocytes: 0.06 K/uL (ref 0.00–0.07)
Basophils Absolute: 0.1 K/uL (ref 0.0–0.1)
Basophils Relative: 1 %
Eosinophils Absolute: 0.1 K/uL (ref 0.0–0.5)
Eosinophils Relative: 1 %
HCT: 41.9 % (ref 39.0–52.0)
Hemoglobin: 13.8 g/dL (ref 13.0–17.0)
Immature Granulocytes: 0 %
Lymphocytes Relative: 10 %
Lymphs Abs: 1.4 K/uL (ref 0.7–4.0)
MCH: 29.2 pg (ref 26.0–34.0)
MCHC: 32.9 g/dL (ref 30.0–36.0)
MCV: 88.6 fL (ref 80.0–100.0)
Monocytes Absolute: 1.4 K/uL — ABNORMAL HIGH (ref 0.1–1.0)
Monocytes Relative: 10 %
Neutro Abs: 11.3 K/uL — ABNORMAL HIGH (ref 1.7–7.7)
Neutrophils Relative %: 78 %
Platelets: 129 K/uL — ABNORMAL LOW (ref 150–400)
RBC: 4.73 MIL/uL (ref 4.22–5.81)
RDW: 15.3 % (ref 11.5–15.5)
WBC: 14.2 K/uL — ABNORMAL HIGH (ref 4.0–10.5)
nRBC: 0 % (ref 0.0–0.2)

## 2024-06-19 LAB — HEPATIC FUNCTION PANEL
ALT: 70 U/L — ABNORMAL HIGH (ref 0–44)
AST: 210 U/L — ABNORMAL HIGH (ref 15–41)
Albumin: 3.6 g/dL (ref 3.5–5.0)
Alkaline Phosphatase: 203 U/L — ABNORMAL HIGH (ref 38–126)
Bilirubin, Direct: 0.4 mg/dL — ABNORMAL HIGH (ref 0.0–0.2)
Indirect Bilirubin: 1 mg/dL — ABNORMAL HIGH (ref 0.3–0.9)
Total Bilirubin: 1.4 mg/dL — ABNORMAL HIGH (ref 0.0–1.2)
Total Protein: 7.8 g/dL (ref 6.5–8.1)

## 2024-06-19 LAB — LIPASE, BLOOD: Lipase: 21 U/L (ref 11–51)

## 2024-06-19 LAB — ETHANOL: Alcohol, Ethyl (B): <15 mg/dL

## 2024-06-19 MED ORDER — LORAZEPAM 2 MG/ML IJ SOLN: 0.0000 mg | Freq: Two times a day (BID) | INTRAMUSCULAR | Status: DC

## 2024-06-19 MED ORDER — THIAMINE MONONITRATE 100 MG PO TABS: 100.0000 mg | ORAL_TABLET | Freq: Every day | ORAL | Status: DC

## 2024-06-19 MED ORDER — ONDANSETRON HCL 4 MG/2ML IJ SOLN
4.0000 mg | Freq: Once | INTRAMUSCULAR | Status: AC
  Administered 2024-06-19: 4 mg via INTRAVENOUS
  Filled 2024-06-19: qty 2

## 2024-06-19 MED ORDER — LORAZEPAM 2 MG PO TABS: 0.0000 mg | ORAL_TABLET | Freq: Two times a day (BID) | ORAL | Status: DC

## 2024-06-19 MED ORDER — LORAZEPAM 2 MG/ML IJ SOLN
2.0000 mg | Freq: Once | INTRAMUSCULAR | Status: AC
  Administered 2024-06-19: 2 mg via INTRAVENOUS
  Filled 2024-06-19: qty 1

## 2024-06-19 MED ORDER — SODIUM CHLORIDE 0.9 % IV BOLUS
1000.0000 mL | Freq: Once | INTRAVENOUS | Status: AC
  Administered 2024-06-19: 1000 mL via INTRAVENOUS

## 2024-06-19 MED ORDER — LORAZEPAM 2 MG PO TABS: 0.0000 mg | ORAL_TABLET | Freq: Four times a day (QID) | ORAL | Status: DC

## 2024-06-19 MED ORDER — LORAZEPAM 2 MG/ML IJ SOLN
0.0000 mg | Freq: Four times a day (QID) | INTRAMUSCULAR | Status: DC
  Administered 2024-06-19 – 2024-06-20 (×2): 2 mg via INTRAVENOUS
  Filled 2024-06-19 (×2): qty 1

## 2024-06-19 MED ORDER — THIAMINE HCL 100 MG/ML IJ SOLN: 100.0000 mg | Freq: Every day | INTRAMUSCULAR | Status: DC

## 2024-06-19 NOTE — ED Provider Notes (Signed)
.----------------------------------------- °  11:15 PM on 06/19/2024 -----------------------------------------  Blood pressure (!) 139/108, pulse (!) 114, temperature 97.6 F (36.4 C), temperature source Oral, resp. rate (!) 29, height 5' 10 (1.778 m), weight 68 kg, SpO2 98%.   In short, Alejandro Collier is a 39 y.o. male with a chief complaint of seizure.  Refer to the original H&P for additional details.  The current plan of care is to follow-up labs, likely admission for alcohol withdrawal seizure.  Glucose as below.  Given alcohol withdrawal and likely alcohol withdrawal seizures, he will need to come in for further management and treatment.  Consult the hospitalist for admission.  He is admitted.   Clinical Course as of 06/20/24 0036  Mon Jun 19, 2024  2358 Independent review of labs, mild leukocytosis, ethanol level is not elevated, electrolytes not severely deranged, creatinine is normal, his LFTs are elevated, suspect alcohol related.  Has a T. bili's are elevated in the past. [TT]  Tue Jun 20, 2024  0021 On examination, he still having tremors, is on CIWA protocol, still tachycardic, will give him some additional IV fluids.  He denies any abdominal pain, he has no right upper quadrant tenderness on exam.  Will plan to admit him for alcohol withdrawal. [TT]    Clinical Course User Index [TT] Waymond Lorelle Cummins, MD     Medications  LORazepam  (ATIVAN ) injection 0-4 mg (2 mg Intravenous Given 06/19/24 2301)    Or  LORazepam  (ATIVAN ) tablet 0-4 mg ( Oral See Alternative 06/19/24 2301)  LORazepam  (ATIVAN ) injection 0-4 mg (has no administration in time range)    Or  LORazepam  (ATIVAN ) tablet 0-4 mg (has no administration in time range)  thiamine  (VITAMIN B1) tablet 100 mg (has no administration in time range)    Or  thiamine  (VITAMIN B1) injection 100 mg (has no administration in time range)  LORazepam  (ATIVAN ) injection 2 mg (2 mg Intravenous Given 06/19/24 2242)  ondansetron  (ZOFRAN )  injection 4 mg (4 mg Intravenous Given 06/19/24 2253)  sodium chloride  0.9 % bolus 1,000 mL (1,000 mLs Intravenous New Bag/Given 06/19/24 2253)     ED Discharge Orders     None      Final diagnoses:  Seizure (HCC)      Waymond Lorelle Cummins, MD 06/20/24 [phone]

## 2024-06-19 NOTE — ED Triage Notes (Addendum)
 Patient presents via EMS from home after witnessed seizure per family. EMS also reports episode of vomiting en route. Patient visibly shaky c/o nausea upon arrival to ED.  Patient smells of ETOH, however denies use.  Hx Alcohol withdrawal seizures

## 2024-06-19 NOTE — ED Notes (Signed)
 CIWA score 14. MD made aware. Will administer ativan  as ordered per CIWA protocol

## 2024-06-19 NOTE — ED Provider Notes (Signed)
 "  [facility] Provider Note    Event Date/Time   First MD Initiated Contact with Patient 06/19/24 2242     (approximate)   History   Seizures   HPI  Alejandro Collier is a 39 y.o. male with history of hypertension, GERD, and alcohol use disorder who presents with an apparent seizure while at work.  The patient states that he was having a lot of nausea and vomiting earlier, and is continued to have nausea and vomiting since the seizure.  He states his last seizure was about a year ago.  The patient reports daily drinking, about a pint of alcohol a day.  He denies significantly decreasing or changing his alcohol intake in the last few days.  However, he has not had any alcohol since yesterday.  He denies any headache or acute pain.  He states that he has been tremulous since earlier today.  I reviewed the past medical records.  The patient has multiple prior ED visits here, most recently last October with hematuria.  Previously he was seen in February of last year also for seizure thought to be due to alcohol withdrawal.  The patient declined admission or treatment for alcohol use at that time.  The patient's last 9 ED visit in our system was in 2019 with [facility] physical therapy for an ankle injury.   Physical Exam   Triage Vital Signs: ED Triage Vitals  Encounter Vitals Group     BP 06/19/24 2222 (!) 155/101     Girls Systolic BP Percentile --      Girls Diastolic BP Percentile --      Boys Systolic BP Percentile --      Boys Diastolic BP Percentile --      Pulse Rate 06/19/24 2222 (!) 104     Resp 06/19/24 2222 (!) 28     Temp 06/19/24 2222 97.6 F (36.4 C)     Temp Source 06/19/24 2222 Oral     SpO2 06/19/24 2222 98 %     Weight 06/19/24 2223 150 lb (68 kg)     Height 06/19/24 2223 5' 10 (1.778 m)     Head Circumference --      Peak Flow --      Pain Score 06/19/24 2223 0     Pain Loc --      Pain Education --      Exclude from Growth Chart  --     Most recent vital signs: Vitals:   06/19/24 2245 06/19/24 2248  BP: (!) 139/108 (!) 139/108  Pulse: (!) 115 (!) 114  Resp: (!) 29   Temp:    SpO2: 98%     General: Alert and oriented, weak appearing, no distress.  CV:  Good peripheral perfusion.  Borderline tachycardic. Resp:  Normal effort.  Abd:  No distention.  Other:  EOMI.  PERRLA.  No photophobia.  No facial droop.  Normal speech.  Motor intact in all extremities.  No ataxia.  Significant bilateral tremor and tongue fasciculation.   ED Results / Procedures / Treatments   Labs (all labs ordered are listed, but only abnormal results are displayed) Labs Reviewed  CBC WITH DIFFERENTIAL/PLATELET - Abnormal; Notable for the following components:      Result Value   WBC 14.2 (*)    Platelets 129 (*)    Neutro Abs 11.3 (*)    Monocytes Absolute 1.4 (*)    All other components within normal limits  ETHANOL  BASIC METABOLIC PANEL WITH GFR  HEPATIC FUNCTION PANEL  LIPASE, BLOOD     EKG  ED ECG REPORT I, Waylon Cassis, the attending physician, personally viewed and interpreted this ECG.  Date: 06/19/2024 EKG Time: 2230 Rate: 86 Rhythm: normal sinus rhythm QRS Axis: normal Intervals: Nonspecific IVCD ST/T Wave abnormalities: Nonspecific abnormalities Narrative Interpretation: no evidence of acute ischemia    RADIOLOGY    PROCEDURES:  Critical Care performed: No  Procedures   MEDICATIONS ORDERED IN ED: Medications  LORazepam  (ATIVAN ) injection 0-4 mg (2 mg Intravenous Given 06/19/24 2301)    Or  LORazepam  (ATIVAN ) tablet 0-4 mg ( Oral See Alternative 06/19/24 2301)  LORazepam  (ATIVAN ) injection 0-4 mg (has no administration in time range)    Or  LORazepam  (ATIVAN ) tablet 0-4 mg (has no administration in time range)  thiamine  (VITAMIN B1) tablet 100 mg (has no administration in time range)    Or  thiamine  (VITAMIN B1) injection 100 mg (has no administration in time range)  LORazepam   (ATIVAN ) injection 2 mg (2 mg Intravenous Given 06/19/24 2242)  ondansetron  (ZOFRAN ) injection 4 mg (4 mg Intravenous Given 06/19/24 2253)  sodium chloride  0.9 % bolus 1,000 mL (1,000 mLs Intravenous New Bag/Given 06/19/24 2253)     IMPRESSION / MDM / ASSESSMENT AND PLAN / ED COURSE  I reviewed the triage vital signs and the nursing notes.  39 year old male with PMH as noted above presents after witnessed seizure also with nausea and vomiting and significant tremor and tongue fasciculation on exam.  The patient's neurologic exam is otherwise nonfocal and he is currently alert and oriented.  There is no evidence of head trauma.  Differential diagnosis includes, but is not limited to, alcohol withdrawal seizure, epileptic seizure, pseudoseizure, syncope.  I have ordered IV Ativan  and fluids and placed the patient on the CIWA protocol.  We will obtain lab workup and reassess.  At this time there is no indication for imaging.  Patient's presentation is most consistent with acute presentation with potential threat to life or bodily function.  The patient is on the cardiac monitor to evaluate for evidence of arrhythmia and/or significant heart rate changes.  ----------------------------------------- 11:21 PM on 06/19/2024 -----------------------------------------  Ethanol level is negative.  CBC shows mild leukocytosis.  The remainder of the lab workup is pending.  I have signed the patient out to the oncoming ED physician Dr. Waymond.  FINAL CLINICAL IMPRESSION(S) / ED DIAGNOSES   Final diagnoses:  Seizure (HCC)     Rx / DC Orders   ED Discharge Orders     None        Note:  This document was prepared using Dragon voice recognition software and may include unintentional dictation errors.    Cassis Waylon, MD 06/19/24 2321  "

## 2024-06-20 DIAGNOSIS — F10939 Alcohol use, unspecified with withdrawal, unspecified: Secondary | ICD-10-CM

## 2024-06-20 DIAGNOSIS — K709 Alcoholic liver disease, unspecified: Secondary | ICD-10-CM

## 2024-06-20 DIAGNOSIS — I10 Essential (primary) hypertension: Secondary | ICD-10-CM

## 2024-06-20 DIAGNOSIS — R569 Unspecified convulsions: Secondary | ICD-10-CM

## 2024-06-20 LAB — HIV ANTIBODY (ROUTINE TESTING W REFLEX): HIV Screen 4th Generation wRfx: NONREACTIVE

## 2024-06-20 MED ORDER — ADULT MULTIVITAMIN W/MINERALS CH
1.0000 | ORAL_TABLET | Freq: Every day | ORAL | Status: DC
  Administered 2024-06-20 – 2024-06-24 (×3): 1 via ORAL
  Filled 2024-06-20 (×3): qty 1

## 2024-06-20 MED ORDER — HYDROCODONE-ACETAMINOPHEN 5-325 MG PO TABS
1.0000 | ORAL_TABLET | ORAL | Status: DC | PRN
  Administered 2024-06-20 – 2024-06-21 (×2): 2 via ORAL
  Filled 2024-06-20 (×4): qty 2

## 2024-06-20 MED ORDER — THIAMINE MONONITRATE 100 MG PO TABS
100.0000 mg | ORAL_TABLET | Freq: Every day | ORAL | Status: DC
  Administered 2024-06-20 – 2024-06-21 (×3): 100 mg via ORAL
  Filled 2024-06-20 (×3): qty 1

## 2024-06-20 MED ORDER — LORAZEPAM 2 MG/ML IJ SOLN: 1.0000 mg | INTRAMUSCULAR | Status: DC | PRN

## 2024-06-20 MED ORDER — SODIUM CHLORIDE 0.9 % IV SOLN: 12.5000 mg | Freq: Four times a day (QID) | INTRAVENOUS | Status: DC | PRN

## 2024-06-20 MED ORDER — LORAZEPAM 2 MG/ML IJ SOLN
1.0000 mg | INTRAMUSCULAR | Status: DC | PRN
  Administered 2024-06-20 (×2): 2 mg via INTRAVENOUS
  Administered 2024-06-21 (×2): 4 mg via INTRAVENOUS
  Administered 2024-06-21: 2 mg via INTRAVENOUS
  Administered 2024-06-21: 4 mg via INTRAVENOUS
  Administered 2024-06-21: 2 mg via INTRAVENOUS
  Filled 2024-06-20: qty 2
  Filled 2024-06-20: qty 1
  Filled 2024-06-20: qty 2
  Filled 2024-06-20: qty 1
  Filled 2024-06-20: qty 2
  Filled 2024-06-20 (×2): qty 1

## 2024-06-20 MED ORDER — DEXTROSE IN LACTATED RINGERS 5 % IV SOLN: INTRAVENOUS | Status: AC

## 2024-06-20 MED ORDER — ACETAMINOPHEN 325 MG PO TABS: 650.0000 mg | ORAL_TABLET | Freq: Four times a day (QID) | ORAL | Status: DC | PRN

## 2024-06-20 MED ORDER — PHENOBARBITAL SODIUM 130 MG/ML IJ SOLN: 130.0000 mg | INTRAMUSCULAR | Status: DC | PRN

## 2024-06-20 MED ORDER — ONDANSETRON HCL 4 MG PO TABS: 4.0000 mg | ORAL_TABLET | Freq: Four times a day (QID) | ORAL | Status: DC | PRN

## 2024-06-20 MED ORDER — THIAMINE HCL 100 MG/ML IJ SOLN: 200.0000 mg | INTRAVENOUS | Status: DC

## 2024-06-20 MED ORDER — PHENOBARBITAL SODIUM 65 MG/ML IJ SOLN: 32.5000 mg | Freq: Three times a day (TID) | INTRAMUSCULAR | Status: DC

## 2024-06-20 MED ORDER — PHENOBARBITAL SODIUM 130 MG/ML IJ SOLN: 130.0000 mg | Freq: Once | INTRAMUSCULAR | Status: DC

## 2024-06-20 MED ORDER — MORPHINE SULFATE (PF) 2 MG/ML IV SOLN: 2.0000 mg | INTRAVENOUS | Status: DC | PRN

## 2024-06-20 MED ORDER — LACTATED RINGERS IV BOLUS
1000.0000 mL | Freq: Once | INTRAVENOUS | Status: AC
  Administered 2024-06-20: 1000 mL via INTRAVENOUS

## 2024-06-20 MED ORDER — PHENOBARBITAL SODIUM 130 MG/ML IJ SOLN
97.5000 mg | Freq: Three times a day (TID) | INTRAMUSCULAR | Status: DC
  Administered 2024-06-20: 97.5 mg via INTRAVENOUS
  Filled 2024-06-20: qty 1

## 2024-06-20 MED ORDER — THIAMINE HCL 100 MG/ML IJ SOLN
100.0000 mg | Freq: Every day | INTRAMUSCULAR | Status: DC
  Administered 2024-06-22 – 2024-06-23 (×2): 100 mg via INTRAVENOUS
  Filled 2024-06-20 (×3): qty 2

## 2024-06-20 MED ORDER — PHENOBARBITAL SODIUM 65 MG/ML IJ SOLN: 65.0000 mg | Freq: Three times a day (TID) | INTRAMUSCULAR | Status: DC

## 2024-06-20 MED ORDER — LISINOPRIL 5 MG PO TABS
5.0000 mg | ORAL_TABLET | Freq: Every day | ORAL | Status: DC
  Administered 2024-06-20 – 2024-06-21 (×2): 5 mg via ORAL
  Filled 2024-06-20 (×3): qty 1

## 2024-06-20 MED ORDER — LACTATED RINGERS IV SOLN: INTRAVENOUS | Status: DC

## 2024-06-20 MED ORDER — THIAMINE MONONITRATE 100 MG PO TABS: 100.0000 mg | ORAL_TABLET | ORAL | Status: DC

## 2024-06-20 MED ORDER — FOLIC ACID 1 MG PO TABS
1.0000 mg | ORAL_TABLET | Freq: Every day | ORAL | Status: DC
  Administered 2024-06-20 – 2024-06-21 (×2): 1 mg via ORAL
  Filled 2024-06-20 (×2): qty 1

## 2024-06-20 MED ORDER — ACETAMINOPHEN 650 MG RE SUPP: 650.0000 mg | Freq: Four times a day (QID) | RECTAL | Status: DC | PRN

## 2024-06-20 MED ORDER — ONDANSETRON HCL 4 MG/2ML IJ SOLN: 4.0000 mg | Freq: Four times a day (QID) | INTRAMUSCULAR | Status: DC | PRN

## 2024-06-20 MED ORDER — ENOXAPARIN SODIUM 40 MG/0.4ML IJ SOSY
40.0000 mg | PREFILLED_SYRINGE | INTRAMUSCULAR | Status: DC
  Administered 2024-06-21 – 2024-07-03 (×13): 40 mg via SUBCUTANEOUS
  Filled 2024-06-20 (×14): qty 0.4

## 2024-06-20 MED ORDER — LORAZEPAM 1 MG PO TABS
1.0000 mg | ORAL_TABLET | ORAL | Status: DC | PRN
  Administered 2024-06-21: 1 mg via ORAL
  Administered 2024-06-21: 2 mg via ORAL
  Administered 2024-06-21: 1 mg via ORAL
  Filled 2024-06-20: qty 1
  Filled 2024-06-20: qty 2
  Filled 2024-06-20: qty 1

## 2024-06-20 NOTE — Progress Notes (Signed)
" °  NON-BILLABLE PROGRESS NOTE (SAME DAY AS ADMISSION)     Assumed Care on 06/20/24 at 7:00 AM. Patient evaluated this morning after overnight admission at 12:53 AM  by nocturnist.  This note reflects assumption of care and updated plan; H&P billed earlier today.    Alejandro Collier  FMW:[id number] DOB: 1986-04-09 DOA: 06/19/2024 PCP: Pcp, No    Brief Narrative:  39 year old male with PMHx of HTN, alcohol use disorder, prior history of alcohol withdrawal seizures, who presented to the ED on 06/20/2024 and was admitted for alcohol withdrawal seizure. Patient's last drink was 1 day prior to admission.  Per report in the ED, the patient had been experiencing significant nausea and vomiting since. He then had a seizure at work. In the ED, temp was 97.6 F, HR 104, RR 28, BP 155/101, SpO2 98% on RA.  CBC showed leukocytosis of 14.2, platelets 129.  BMP showed bicarb 20, anion gap 26.  HFP showed AST 220, ALT 70, alk phos 203, T. bili 1.4, direct bili 0.4, indirect bili 1.0.  Lites is WNL.  Ethanol <15.  Brief A&P (remainder of plan per H&P)  # Alcohol withdrawal seizure # Alcohol abuse disorder - Switched to CIWA with PRN Ativan  - Thiamine , folate, MV supplementation - Seizure precautions  # Alcohol related liver injury - AST/ALT > 3:1 ratio - Trend HFP and INR tomorrow morning  # HAGMA - Likely alcoholic ketoacidosis - D5 LR 75 mL/hr for 13 hours  # HTN - Continue lisinopril  5 mg daily  # Thrombocytopenia - Likely alcohol related - Continue to trend   # Leukocytosis - Likely stress response  - Continue to monitor  # Nausea/Vomiting - PRN antiemetics  DVT prophylaxis: enoxaparin  (LOVENOX ) injection 40 mg Start: 06/20/24 1000   Kirke Breach Al-Sultani, MD [facility]  If 7PM-7AM, please contact night-coverage  06/20/2024, 7:41 PM      "

## 2024-06-20 NOTE — TOC CM/SW Note (Signed)
 TOC consult received for substance abuse education/counseling. TOC does not provide counseling. Resources have been added to the AVS.

## 2024-06-20 NOTE — Hospital Course (Signed)
 Alejandro Collier

## 2024-06-20 NOTE — ED Notes (Signed)
 Pt assisted to bedside potty. Slightly unsteady when stood, this tech assisted.

## 2024-06-20 NOTE — Assessment & Plan Note (Signed)
 Phenobarbital  withdrawal protocol Ativan  as needed seizures Seizure fall and aspiration precautions

## 2024-06-20 NOTE — Plan of Care (Signed)
  Problem: Pain Managment: Goal: General experience of comfort will improve and/or be controlled Outcome: Progressing

## 2024-06-20 NOTE — Assessment & Plan Note (Signed)
"  Lisinopril  5mg  daily   "

## 2024-06-20 NOTE — Plan of Care (Signed)
   Problem: Education: Goal: Knowledge of General Education information will improve Description Including pain rating scale, medication(s)/side effects and non-pharmacologic comfort measures Outcome: Progressing   Problem: Health Behavior/Discharge Planning: Goal: Ability to manage health-related needs will improve Outcome: Progressing

## 2024-06-20 NOTE — H&P (Signed)
 " History and Physical    Patient: Alejandro Collier FMW:[id number] DOB: 1985-12-05 DOA: 06/19/2024 DOS: the patient was seen and examined on 06/20/2024 PCP: Pcp, No  Patient coming from: Home  Chief Complaint:  Chief Complaint  Patient presents with   Seizures    HPI: Alejandro Collier is a 39 y.o. male with medical history significant for Hypertension and alcohol use disorder being admitted with an alcohol withdrawal seizure, witnessed by his family.  Last drink was a day prior..  No vomiting, diarrhea or abdominal pain. In the ED tachycardic, tachypneic and hypertensive.  Blood work notable for leukocytosis of 14,000, EtOH<15, abnormal LFTs with AST/ALT 210/70, alk phos 203 and total bili 1.4 with normal lipase of 21.  Anion gap 26 with bicarb of 20.  No significant electrolyte abnormalities except for hypochloremia EKG with sinus tachycardia No imaging done Patient received multiple doses of lorazepam  in the ED per CIWA and got IV hydration. Admission requested.    Past Medical History:  Diagnosis Date   Alopecia    Dyspnea    PT STATES THIS IS CHRONIC AND THAT HE CAN WALK A MILE WITHOUT GETTING SOB OR HAVING CP   GERD (gastroesophageal reflux disease)    OCC-NO MEDS   Hypertension    PT NEVER WENT BACK AND GOT HIS LISINOPRIL  FILLED   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 IN 2016   Past Surgical History:  Procedure Laterality Date   ORIF ANKLE FRACTURE Right 07/30/2017   Procedure: OPEN REDUCTION INTERNAL FIXATION (ORIF) ANKLE FRACTURE;  Surgeon: Tobie Priest, MD;  Location: [facility] ORS;  Service: Orthopedics;  Laterality: Right;   SPLENECTOMY  2008   SPLENECTOMY     INJURED PLAYING SPORTS AND HAD INTERNAL BLEEDING   SYNDESMOSIS REPAIR Right 07/30/2017   Procedure: POSSIBLE SYNDESMOSIS REPAIR;  Surgeon: Tobie Priest, MD;  Location: [facility] ORS;  Service: Orthopedics;  Laterality: Right;   WISDOM TOOTH EXTRACTION     Social History:  reports that he has  never smoked. He has never used smokeless tobacco. He reports current alcohol use of about 21.0 standard drinks of alcohol per week. He reports current drug use. Drug: Other-see comments.  Allergies[1]  History reviewed. No pertinent family history.  Prior to Admission medications  Medication Sig Start Date End Date Taking? Authorizing Provider  cyclobenzaprine  (FLEXERIL ) 5 MG tablet Take 1 tablet (5 mg total) by mouth 3 (three) times daily as needed. Patient not taking: Reported on 06/20/2024 11/11/21   Menshew, Candida LULLA Kings, PA-C  lisinopril  (ZESTRIL ) 5 MG tablet Take 1 tablet (5 mg total) by mouth daily. 11/11/21 11/11/22  Menshew, Candida LULLA Kings, PA-C    Physical Exam: Vitals:   06/19/24 2300 06/20/24 0001 06/20/24 0015 06/20/24 0030  BP: (!) 143/109 (!) 160/105  (!) 164/90  Pulse: (!) 119 98 (!) 109   Resp: (!) 28   (!) 26  Temp:      TempSrc:      SpO2: 96%     Weight:      Height:       Physical Exam Vitals and nursing note reviewed.  Constitutional:      General: He is not in acute distress.    Comments: Somnolent from sedation given in the ED  HENT:     Head: Normocephalic and atraumatic.  Cardiovascular:     Rate and Rhythm: Normal rate and regular rhythm.     Heart sounds: Normal heart sounds.  Pulmonary:  Effort: Pulmonary effort is normal.     Breath sounds: Normal breath sounds.  Abdominal:     Palpations: Abdomen is soft.     Tenderness: There is no abdominal tenderness.     Labs on Admission: I have personally reviewed following labs and imaging studies  CBC: Recent Labs  Lab 06/19/24 2235  WBC 14.2*  NEUTROABS 11.3*  HGB 13.8  HCT 41.9  MCV 88.6  PLT 129*   Basic Metabolic Panel: Recent Labs  Lab 06/19/24 2312  NA 138  K 3.6  CL 93*  CO2 20*  GLUCOSE 126*  BUN 7  CREATININE 0.80  CALCIUM 8.9   GFR: Estimated Creatinine Clearance: 120.4 mL/min (by C-G formula based on SCr of 0.8 mg/dL). Liver Function Tests: Recent Labs  Lab  06/19/24 2312  AST 210*  ALT 70*  ALKPHOS 203*  BILITOT 1.4*  PROT 7.8  ALBUMIN 3.6   Recent Labs  Lab 06/19/24 2312  LIPASE 21   No results for input(s): AMMONIA in the last 168 hours. Coagulation Profile: No results for input(s): INR, PROTIME in the last 168 hours. Cardiac Enzymes: No results for input(s): CKTOTAL, CKMB, CKMBINDEX, TROPONINI in the last 168 hours. BNP (last 3 results) No results for input(s): PROBNP in the last 8760 hours. HbA1C: No results for input(s): HGBA1C in the last 72 hours. CBG: No results for input(s): GLUCAP in the last 168 hours. Lipid Profile: No results for input(s): CHOL, HDL, LDLCALC, TRIG, CHOLHDL, LDLDIRECT in the last 72 hours. Thyroid Function Tests: No results for input(s): TSH, T4TOTAL, FREET4, T3FREE, THYROIDAB in the last 72 hours. Anemia Panel: No results for input(s): VITAMINB12, FOLATE, FERRITIN, TIBC, IRON, RETICCTPCT in the last 72 hours. Urine analysis:    Component Value Date/Time   COLORURINE RED (A) 03/23/2024 1243   APPEARANCEUR TURBID (A) 03/23/2024 1243   LABSPEC  03/23/2024 1243    TEST NOT REPORTED DUE TO COLOR INTERFERENCE OF URINE PIGMENT   PHURINE  03/23/2024 1243    TEST NOT REPORTED DUE TO COLOR INTERFERENCE OF URINE PIGMENT   GLUCOSEU (A) 03/23/2024 1243    TEST NOT REPORTED DUE TO COLOR INTERFERENCE OF URINE PIGMENT   HGBUR (A) 03/23/2024 1243    TEST NOT REPORTED DUE TO COLOR INTERFERENCE OF URINE PIGMENT   BILIRUBINUR (A) 03/23/2024 1243    TEST NOT REPORTED DUE TO COLOR INTERFERENCE OF URINE PIGMENT   KETONESUR (A) 03/23/2024 1243    TEST NOT REPORTED DUE TO COLOR INTERFERENCE OF URINE PIGMENT   PROTEINUR (A) 03/23/2024 1243    TEST NOT REPORTED DUE TO COLOR INTERFERENCE OF URINE PIGMENT   NITRITE (A) 03/23/2024 1243    TEST NOT REPORTED DUE TO COLOR INTERFERENCE OF URINE PIGMENT   LEUKOCYTESUR (A) 03/23/2024 1243    TEST NOT REPORTED DUE TO  COLOR INTERFERENCE OF URINE PIGMENT    Radiological Exams on Admission: No results found. Data Reviewed for HPI: Relevant notes from primary care and specialist visits, past discharge summaries as available in EHR, including Care Everywhere. Prior diagnostic testing as pertinent to current admission diagnoses Updated medications and problem lists for reconciliation ED course, including vitals, labs, imaging, treatment and response to treatment Triage notes, nursing and pharmacy notes and ED provider's notes Notable results as noted above in HPI      Assessment and Plan: * 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, with unspecified complication (HCC) Phenobarbital  withdrawal protocol Ativan  as needed seizures Seizure fall and aspiration precautions  Alcoholic liver disease Possible alcoholic  ketoacidosis Abnormal LFTs with AST/ALT 210/70, alk phos 203 and total bili 1.4 with normal lipase of 21.  Anion gap 26 with bicarb of 20 Monitoring for improvement with IV hydration Monitor electrolytes Will need alcohol cessation counseling when improved  Essential hypertension Lisinopril  5 mg daily    DVT prophylaxis: Lovenox   Consults: none  Advance Care Planning:   Code Status: Full Code   Family Communication: none  Disposition Plan: Back to previous home environment  Severity of Illness: The appropriate patient status for this patient is INPATIENT. Inpatient status is judged to be reasonable and necessary in order to provide the required intensity of service to ensure the patient's safety. The patient's presenting symptoms, physical exam findings, and initial radiographic and laboratory data in the context of their chronic comorbidities is felt to place them at high risk for further clinical deterioration. Furthermore, it is not anticipated that the patient will be medically stable for discharge from the hospital within 2 midnights of admission.   * I certify that at the point of  admission it is my clinical judgment that the patient will require inpatient hospital care spanning beyond 2 midnights from the point of admission due to high intensity of service, high risk for further deterioration and high frequency of surveillance required.*  Author: Delayne LULLA Solian, MD 06/20/2024 12:53 AM  For on call review [email or web address].      [1]  Allergies Allergen Reactions   Penicillins    "

## 2024-06-20 NOTE — ED Notes (Signed)
 NURSE [facility] RN INFORMED OF BED ASSIGNED

## 2024-06-20 NOTE — Assessment & Plan Note (Signed)
 Transaminitis. Possible alcoholic ketoacidosis Slowly improving liver enzymes.  Likely secondary to significant alcohol intake. -Ordered RUQ ultrasound -Continuing IV fluid for another day -Monitor liver function

## 2024-06-20 NOTE — ED Notes (Signed)
 At this time, this EDT answered a call light from this pt, who wanted to get off the [facility]. This EDT provided fresh underwear and pt was able to do self peri care. Pt is now eating at the side of the bed, call light within reach and no other requests at this time.

## 2024-06-20 NOTE — ED Notes (Signed)
 Assisted pt to use urinal to urinate.

## 2024-06-20 NOTE — ED Notes (Signed)
 Report given to [facility]. Patient being moved to ED room 34

## 2024-06-20 NOTE — Progress Notes (Signed)
" ° °  Brief Progress Note   _____________________________________________________________________________________________________________  Patient Name: Alejandro Collier Patient DOB: 01/03/1986 Date: @TODAY @      Data: Reviewed labs, notes, VS.     Action: No action required at this time.     Response:    _____________________________________________________________________________________________________________  [facility] RN Expeditor Sharolyn JONETTA Batman Please contact us  directly via secure chat (search for [facility]) or by calling us  at [phone] [facility]).  "

## 2024-06-21 DIAGNOSIS — D72829 Elevated white blood cell count, unspecified: Secondary | ICD-10-CM

## 2024-06-21 DIAGNOSIS — D696 Thrombocytopenia, unspecified: Secondary | ICD-10-CM

## 2024-06-21 DIAGNOSIS — E876 Hypokalemia: Secondary | ICD-10-CM

## 2024-06-21 DIAGNOSIS — R7401 Elevation of levels of liver transaminase levels: Secondary | ICD-10-CM

## 2024-06-21 LAB — CBC
HCT: 37.3 % — ABNORMAL LOW (ref 39.0–52.0)
Hemoglobin: 12.8 g/dL — ABNORMAL LOW (ref 13.0–17.0)
MCH: 29.3 pg (ref 26.0–34.0)
MCHC: 34.3 g/dL (ref 30.0–36.0)
MCV: 85.4 fL (ref 80.0–100.0)
Platelets: 104 K/uL — ABNORMAL LOW (ref 150–400)
RBC: 4.37 MIL/uL (ref 4.22–5.81)
RDW: 14.7 % (ref 11.5–15.5)
WBC: 8.6 K/uL (ref 4.0–10.5)
nRBC: 0 % (ref 0.0–0.2)

## 2024-06-21 LAB — BASIC METABOLIC PANEL WITH GFR
Anion gap: 13 (ref 5–15)
BUN: 7 mg/dL (ref 6–20)
CO2: 31 mmol/L (ref 22–32)
Calcium: 8.4 mg/dL — ABNORMAL LOW (ref 8.9–10.3)
Chloride: 93 mmol/L — ABNORMAL LOW (ref 98–111)
Creatinine, Ser: 0.58 mg/dL — ABNORMAL LOW (ref 0.61–1.24)
GFR, Estimated: >60 mL/min
Glucose, Bld: 91 mg/dL (ref 70–99)
Potassium: 2.7 mmol/L — CL (ref 3.5–5.1)
Sodium: 137 mmol/L (ref 135–145)

## 2024-06-21 LAB — PHOSPHORUS: Phosphorus: 3.4 mg/dL (ref 2.5–4.6)

## 2024-06-21 LAB — HEPATIC FUNCTION PANEL
ALT: 45 U/L — ABNORMAL HIGH (ref 0–44)
AST: 110 U/L — ABNORMAL HIGH (ref 15–41)
Albumin: 3.1 g/dL — ABNORMAL LOW (ref 3.5–5.0)
Alkaline Phosphatase: 152 U/L — ABNORMAL HIGH (ref 38–126)
Bilirubin, Direct: 0.4 mg/dL — ABNORMAL HIGH (ref 0.0–0.2)
Indirect Bilirubin: 1 mg/dL — ABNORMAL HIGH (ref 0.3–0.9)
Total Bilirubin: 1.4 mg/dL — ABNORMAL HIGH (ref 0.0–1.2)
Total Protein: 6.7 g/dL (ref 6.5–8.1)

## 2024-06-21 LAB — TSH: TSH: 3.7 u[IU]/mL (ref 0.350–4.500)

## 2024-06-21 LAB — PROTIME-INR
INR: 1 (ref 0.8–1.2)
Prothrombin Time: 13.3 s (ref 11.4–15.2)

## 2024-06-21 LAB — MAGNESIUM: Magnesium: 0.9 mg/dL — CL (ref 1.7–2.4)

## 2024-06-21 MED ORDER — PHENOBARBITAL SODIUM 130 MG/ML IJ SOLN
97.5000 mg | Freq: Three times a day (TID) | INTRAMUSCULAR | Status: DC
  Administered 2024-06-21 – 2024-06-23 (×5): 97.5 mg via INTRAVENOUS
  Filled 2024-06-21 (×6): qty 1

## 2024-06-21 MED ORDER — LORAZEPAM 1 MG PO TABS: 1.0000 mg | ORAL_TABLET | ORAL | Status: DC | PRN

## 2024-06-21 MED ORDER — MAGNESIUM SULFATE 4 GM/100ML IV SOLN
4.0000 g | Freq: Once | INTRAVENOUS | Status: AC
  Administered 2024-06-21: 4 g via INTRAVENOUS
  Filled 2024-06-21: qty 100

## 2024-06-21 MED ORDER — POTASSIUM CHLORIDE CRYS ER 20 MEQ PO TBCR: 40.0000 meq | EXTENDED_RELEASE_TABLET | ORAL | Status: DC

## 2024-06-21 MED ORDER — PHENOBARBITAL SODIUM 130 MG/ML IJ SOLN
130.0000 mg | Freq: Once | INTRAMUSCULAR | Status: AC
  Administered 2024-06-21: 130 mg via INTRAVENOUS
  Filled 2024-06-21: qty 1

## 2024-06-21 MED ORDER — DEXMEDETOMIDINE HCL IN NACL 200 MCG/50ML IV SOLN: 0.0000 ug/kg/h | INTRAVENOUS | Status: DC

## 2024-06-21 MED ORDER — LORAZEPAM 2 MG/ML IJ SOLN
1.0000 mg | INTRAMUSCULAR | Status: DC | PRN
  Administered 2024-06-22 (×2): 4 mg via INTRAVENOUS
  Filled 2024-06-21 (×2): qty 2

## 2024-06-21 MED ORDER — PHENOBARBITAL SODIUM 65 MG/ML IJ SOLN: 32.5000 mg | Freq: Three times a day (TID) | INTRAMUSCULAR | Status: DC

## 2024-06-21 MED ORDER — PHENOBARBITAL SODIUM 65 MG/ML IJ SOLN: 65.0000 mg | Freq: Three times a day (TID) | INTRAMUSCULAR | Status: DC

## 2024-06-21 MED ORDER — PHENOBARBITAL SODIUM 130 MG/ML IJ SOLN
130.0000 mg | Freq: Once | INTRAMUSCULAR | Status: AC
  Administered 2024-06-22: 130 mg via INTRAVENOUS

## 2024-06-21 MED ORDER — PHENOBARBITAL SODIUM 130 MG/ML IJ SOLN: 130.0000 mg | Freq: Once | INTRAMUSCULAR | Status: DC

## 2024-06-21 MED ORDER — LACTATED RINGERS IV SOLN: INTRAVENOUS | Status: DC

## 2024-06-21 MED ORDER — METOPROLOL TARTRATE 25 MG PO TABS
12.5000 mg | ORAL_TABLET | Freq: Two times a day (BID) | ORAL | Status: DC
  Administered 2024-06-21: 12.5 mg via ORAL
  Filled 2024-06-21 (×2): qty 1

## 2024-06-21 MED ORDER — POTASSIUM CHLORIDE CRYS ER 20 MEQ PO TBCR
40.0000 meq | EXTENDED_RELEASE_TABLET | Freq: Three times a day (TID) | ORAL | Status: DC
  Administered 2024-06-21 (×2): 40 meq via ORAL
  Filled 2024-06-21 (×3): qty 2

## 2024-06-21 NOTE — Assessment & Plan Note (Signed)
 Magnesium  of 0.9 this morning. - Replace magnesium  and monitor

## 2024-06-21 NOTE — Assessment & Plan Note (Signed)
 Likely secondary to alcoholic liver disease. - Continue to monitor

## 2024-06-21 NOTE — Plan of Care (Signed)
" °  Problem: Health Behavior/Discharge Planning: Goal: Ability to manage health-related needs will improve Outcome: Not Progressing   Problem: Clinical Measurements: Goal: Ability to maintain clinical measurements within normal limits will improve Outcome: Progressing Goal: Respiratory complications will improve Outcome: Progressing Goal: Cardiovascular complication will be avoided Outcome: Progressing   Problem: Activity: Goal: Risk for activity intolerance will decrease Outcome: Progressing   Problem: Coping: Goal: Level of anxiety will decrease Outcome: Not Progressing   Problem: Nutrition: Goal: Adequate nutrition will be maintained Outcome: Progressing   Problem: Pain Managment: Goal: General experience of comfort will improve and/or be controlled Outcome: Progressing   Problem: Safety: Goal: Ability to remain free from injury will improve Outcome: Progressing   "

## 2024-06-21 NOTE — Discharge Instructions (Addendum)
 Check cbc and bmp on follow up appointment

## 2024-06-21 NOTE — Progress Notes (Signed)
" °  Progress Note   Patient: Alejandro Collier DOB: 1985-09-05 DOA: 06/19/2024     1 DOS: the patient was seen and examined on 06/21/2024   Brief hospital course: Partly taken from prior notes.  Alejandro Collier is a 39 y.o. male with medical history significant for Hypertension and alcohol use disorder being admitted with an alcohol withdrawal seizure, witnessed by his family.  Last drink was a day prior..  No vomiting, diarrhea or abdominal pain.   On presentation patient was tachycardic, tachypneic and hypotensive.  Labs with leukocytosis of 14,000, EtOH<15, abnormal LFTs with AST/ALT 210/70, alk phos 203 and total bili 1.4 with normal lipase of 21. Anion gap 26 with bicarb of 20.  EKG with sinus tachycardia.  Patient was started on CIWA protocol with Ativan  and was given IV hydration.  1/21: Remained mildly tachycardic and hypertensive.  Leukocytosis resolved and improving transaminitis. Hypokalemia and hypomagnesemia-electrolytes are being repleted.  Checking  TSH, also ordered RUQ ultrasound.  Patient will be n.p.o. after midnight for ultrasound tomorrow.  Assessment and Plan: * 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, with unspecified complication (HCC) Patient did not had any more seizure. Likely secondary to alcohol withdrawal. -No imaging was obtained -Need to have brain MRI if there is any recurrence -No antiseizure medications were given -Continue to monitor - Continue with CIWA protocol with Ativan   Alcoholic liver disease Transaminitis. Possible alcoholic ketoacidosis Slowly improving liver enzymes.  Likely secondary to significant alcohol intake. -Ordered RUQ ultrasound -Continuing IV fluid for another day -Monitor liver function  Essential hypertension Blood pressure elevated.  Patient was on a small dose of lisinopril  at home. - Continuing home lisinopril  -Add low-dose metoprolol  -Continue to monitor  Hypokalemia Potassium of 2.7 with significant  hypomagnesemia. - Replace potassium and monitor  Hypomagnesemia Magnesium  of 0.9 this morning. - Replace magnesium  and monitor  Leukocytosis Likely reactive on admission and has been resolved.  Thrombocytopenia Likely secondary to alcoholic liver disease. - Continue to monitor   Subjective: Patient was seen and examined today.  Seems to have mild cognitive impairment.  No new concern.  Physical Exam: Vitals:   06/21/24 1135 06/21/24 1135 06/21/24 1200 06/21/24 1231  BP: (!) 142/110 (!) 142/110    Pulse:  (!) 104    Resp:   20 18  Temp:  (!) 97.5 F (36.4 C)    TempSrc:  Oral    SpO2:  97%    Weight:      Height:       General.  Malnourished gentleman, in no acute distress. Pulmonary.  Lungs clear bilaterally, normal respiratory effort. CV.  Regular rate and rhythm, no JVD, rub or murmur. Abdomen.  Soft, nontender, nondistended, BS positive. CNS.  Alert and oriented .  No focal neurologic deficit. Extremities.  No edema, no cyanosis, pulses intact and symmetrical. Psychiatry.  Seems to have mild cognitive impairment.   Data Reviewed: Prior data reviewed  Family Communication: Tried calling mother with no response.  Disposition: Status is: Inpatient Remains inpatient appropriate because: Severity of illness  Planned Discharge Destination: Home  DVT prophylaxis.  Lovenox  Time spent: 50 minutes  This record has been created using Conservation officer, historic buildings. Errors have been sought and corrected,but may not always be located. Such creation errors do not reflect on the standard of care.   Author: Amaryllis Dare, MD 06/21/2024 1:33 PM  For on call review [email or web address].  "

## 2024-06-21 NOTE — TOC CM/SW Note (Addendum)
 Transition of Care [facility]) - Inpatient Brief Assessment   Patient Details  Name: Alejandro Collier MRN: [id number] Date of Birth: 12-11-1985  Transition of Care [facility]) CM/SW Contact:    Shasta DELENA Daring, RN Phone Number: 06/21/2024, 3:34 PM   Clinical Narrative:  Free clinic resources given to patient  Transition of Care Department [facility]) has reviewed patient and no TOC needs have been identified at this time.  If new patient transition needs arise, please place a TOC consult.   Transition of Care Asessment: Insurance and Status: Selfpay Patient has primary care physician: No     Prior/Current Home Services: No current home services Social Drivers of Health Review: SDOH reviewed interventions complete   Transition of care needs: no transition of care needs at this time

## 2024-06-21 NOTE — Assessment & Plan Note (Signed)
 Likely reactive on admission and has been resolved.

## 2024-06-21 NOTE — Hospital Course (Addendum)
 39 year old male with past medical history significant for hypertension, GERD, renal disorder, chronic dyspnea and alcohol use disorder admitted with alcohol withdrawal and seizure witnessed while he was at work. It was reported his last drink was on 1/19.  He does have a history of seizures with alcohol withdrawal over a year ago. He denied significant changes to his amount of alcohol intake prior to arrival.    ED Course: Upon arrival via EMS, it was reported he had one episode of vomiting en route. On presentation patient was tachycardic, tachypneic and hypotensive. He denied symptoms of nausea/vomiting, diarrhea or abdominal pain. Once arrived to the ED, he was acutely agitated requiring multiple doses of ativan  and started on IV fluids  1/19: Admitted following a witnessed seizure at home in the setting of alcohol withdrawal. Started on phenobarbital  taper with ativan  prn 1/20: Remained agitated with phenobarbital  and prn ativan  1/21: rapid response due to patient being agitated requiring high doses of Ativan  for alcohol withdraw was then started on phenobarbital  taper and on a Precedex  drip transferred to stepdown  1/22: Remained on precedex , phenobarb and prn ativan  with agitation and combativeness. 1/23: Still on precedex  gtt with acute agitation with spells of violence. PCCM consulted d/t precedex . Intubated for inability to protect airway after receiving phenobarbital  for increasing agitation overnight. 1/26: Pt remains mechanically intubated followed commands during WUA, however secretion burden precludes extubation.  Self extubated respiratory status stable on HHFNC, however pt agitated/delirious/violent with inability to redirect requiring haldol /precedex  gtt/bilateral soft wrist restraints  1/27: Pt self extubated on 01/26 stable from respiratory standpoint currently on RA.  Remains on precedex  gtt with intermittent agitation.  Speech and psychiatry consulted appreciate input   06/28/24.   Patient transferred to medical service.  Still having a little tremor will put on low-dose Librium .  Blood pressure and heart rate high will start low-dose clonidine  and Toprol .  Replace IV magnesium . 06/29/2024.  Looking into acute inpatient rehab.  Will titrate clonidine  up to 3 times daily dosing.  Not seeing a tremor today.  Discontinue folic acid  with thrombocytosis.  Replace IV magnesium  again today. 06/30/2024.  Add Norvasc  for high blood pressure.  Increase Toprol . 07/01/2024.  Platelet elevated at 1113.  Case discussed with hematology and stated that this is reactive and should come down over time. 07/02/2024, platelet count 1108.  Blood pressure is trending better. 07/03/2024.  Platelet count to 1097.  Will discharge home since blood pressure is better.  Patient ambulating better.

## 2024-06-21 NOTE — Assessment & Plan Note (Signed)
 Potassium of 2.7 with significant hypomagnesemia. - Replace potassium and monitor

## 2024-06-21 NOTE — Progress Notes (Signed)
" °  ° °      CROSS COVER NOTE  NAME: Alejandro Collier MRN: [id number] DOB : 01-25-1986 ATTENDING PHYSICIAN: Jerelene Critchley, MD    Date of Service   06/22/2024   HPI/Events of Note   Subjective: Message received from RN this pt's behavior has been escalating despite getting a total of 10 mg Ativan  in the past 2.5 hours. He is agitated, pulling all his leads off, making threatening gestures towards staff, repeatedly trying to leave the room saying his ride is here, cussing at staff. Alert and oriented to self. Charge wanted to ask if you can please evaluate and see if he can be transferred to a higher level of care to start on a Precedex  drip? He is a high safety risk to self and staff at this time  On my evaluation patient has received 130 mg of phenobarbital  and is beginning to feel the affects, he is tremulous. He continues to try to stand up frequently and is agitated by the staff who is standing over him in an attempt to keep him from ambulating and he attempts to push staff members off of him.   2341: Checked on patient, he is currently in bed asleep with sitter at bedside. Sitter reports he's been sleeping for ~1hr.  2357: Rapid Response called for resurgence of symptoms initially reported  Review of Systems  Unable to perform ROS: Other (patient not participatory)    HPI: 39 y.o. male with medical history significant for Hypertension and alcohol use disorder admitted with an alcohol withdrawal seizure.  Objective:  Vitals:   06/22/24 0322 06/22/24 0330 06/22/24 0400 06/22/24 0500  BP: (!) 163/114 120/78 108/71 123/79  Pulse:  89 87 82  Resp:  11 12 11   Temp:      TempSrc:      SpO2:  98% 98% 98%  Weight:      Height:        Physical Exam Vitals and nursing note reviewed.  Cardiovascular:     Rate and Rhythm: Normal rate and regular rhythm.  Pulmonary:     Effort: Pulmonary effort is normal.     Breath sounds: Normal breath sounds.  Skin:    General: Skin is warm  and dry.     Capillary Refill: Capillary refill takes less than 2 seconds.  Neurological:     Mental Status: He is alert.       Interventions   Assessment/Plan: Add Phenobarbital  Taper Transfer to Stepdown for Precedex ; goal RASS 0 to -1. Will need PCCM consult if unable to wean in 24 hours        To reach the provider On-Call:   7AM- 7PM see care teams to locate the attending and reach out to them via [email or web address]. Password: [email or web address] 7PM-7AM contact night-coverage If you still have difficulty reaching the appropriate provider, please page the [facility] (Director on Call) for [facility] on amion for assistance  This document was prepared using Conservation officer, historic buildings and may include unintentional dictation errors.  Rockie Rams  FNP-BC, PMHNP-BC Nurse Practitioner [facility] [place]  "

## 2024-06-22 ENCOUNTER — Inpatient Hospital Stay: Payer: Self-pay

## 2024-06-22 LAB — CBC
HCT: 37.4 % — ABNORMAL LOW (ref 39.0–52.0)
Hemoglobin: 12.3 g/dL — ABNORMAL LOW (ref 13.0–17.0)
MCH: 28.8 pg (ref 26.0–34.0)
MCHC: 32.9 g/dL (ref 30.0–36.0)
MCV: 87.6 fL (ref 80.0–100.0)
Platelets: 129 K/uL — ABNORMAL LOW (ref 150–400)
RBC: 4.27 MIL/uL (ref 4.22–5.81)
RDW: 14.6 % (ref 11.5–15.5)
WBC: 8.2 K/uL (ref 4.0–10.5)
nRBC: 0 % (ref 0.0–0.2)

## 2024-06-22 LAB — COMPREHENSIVE METABOLIC PANEL WITH GFR
ALT: 48 U/L — ABNORMAL HIGH (ref 0–44)
AST: 118 U/L — ABNORMAL HIGH (ref 15–41)
Albumin: 3.3 g/dL — ABNORMAL LOW (ref 3.5–5.0)
Alkaline Phosphatase: 148 U/L — ABNORMAL HIGH (ref 38–126)
Anion gap: 10 (ref 5–15)
BUN: 7 mg/dL (ref 6–20)
CO2: 30 mmol/L (ref 22–32)
Calcium: 8.5 mg/dL — ABNORMAL LOW (ref 8.9–10.3)
Chloride: 98 mmol/L (ref 98–111)
Creatinine, Ser: 0.52 mg/dL — ABNORMAL LOW (ref 0.61–1.24)
GFR, Estimated: >60 mL/min
Glucose, Bld: 128 mg/dL — ABNORMAL HIGH (ref 70–99)
Potassium: 3.4 mmol/L — ABNORMAL LOW (ref 3.5–5.1)
Sodium: 139 mmol/L (ref 135–145)
Total Bilirubin: 1 mg/dL (ref 0.0–1.2)
Total Protein: 6.7 g/dL (ref 6.5–8.1)

## 2024-06-22 LAB — GLUCOSE, CAPILLARY: Glucose-Capillary: 116 mg/dL — ABNORMAL HIGH (ref 70–99)

## 2024-06-22 LAB — MAGNESIUM: Magnesium: 1.7 mg/dL (ref 1.7–2.4)

## 2024-06-22 LAB — PHOSPHORUS: Phosphorus: 4.1 mg/dL (ref 2.5–4.6)

## 2024-06-22 LAB — MRSA NEXT GEN BY PCR, NASAL: MRSA by PCR Next Gen: NOT DETECTED

## 2024-06-22 MED ORDER — LACTATED RINGERS IV SOLN: INTRAVENOUS | Status: DC

## 2024-06-22 MED ORDER — LORAZEPAM 2 MG/ML IJ SOLN
1.0000 mg | INTRAMUSCULAR | Status: DC | PRN
  Administered 2024-06-22: 2 mg via INTRAVENOUS
  Administered 2024-06-23: 4 mg via INTRAVENOUS
  Administered 2024-06-23: 2 mg via INTRAVENOUS
  Administered 2024-06-23: 4 mg via INTRAVENOUS
  Filled 2024-06-22 (×2): qty 1
  Filled 2024-06-22 (×2): qty 2

## 2024-06-22 MED ORDER — DEXMEDETOMIDINE HCL IN NACL 400 MCG/100ML IV SOLN
0.0000 ug/kg/h | INTRAVENOUS | Status: DC
  Administered 2024-06-22 (×2): 1.2 ug/kg/h via INTRAVENOUS
  Administered 2024-06-22: 1 ug/kg/h via INTRAVENOUS
  Administered 2024-06-22: 0.7 ug/kg/h via INTRAVENOUS
  Administered 2024-06-23 (×3): 1.2 ug/kg/h via INTRAVENOUS
  Administered 2024-06-23: 0.8 ug/kg/h via INTRAVENOUS
  Administered 2024-06-23 – 2024-06-24 (×3): 1.2 ug/kg/h via INTRAVENOUS
  Filled 2024-06-22 (×9): qty 100

## 2024-06-22 MED ORDER — LORAZEPAM 2 MG/ML IJ SOLN: 4.0000 mg | Freq: Once | INTRAMUSCULAR | Status: AC

## 2024-06-22 MED ORDER — LORAZEPAM 1 MG PO TABS: 1.0000 mg | ORAL_TABLET | ORAL | Status: DC | PRN

## 2024-06-22 MED ORDER — CHLORHEXIDINE GLUCONATE CLOTH 2 % EX PADS
6.0000 | MEDICATED_PAD | Freq: Every day | CUTANEOUS | Status: DC
  Administered 2024-06-22 – 2024-06-28 (×7): 6 via TOPICAL

## 2024-06-22 MED ORDER — CHLORHEXIDINE GLUCONATE CLOTH 2 % EX PADS
6.0000 | MEDICATED_PAD | Freq: Every day | CUTANEOUS | Status: DC
  Administered 2024-06-22: 6 via TOPICAL

## 2024-06-22 MED ORDER — HYDRALAZINE HCL 20 MG/ML IJ SOLN
5.0000 mg | Freq: Four times a day (QID) | INTRAMUSCULAR | Status: DC | PRN
  Administered 2024-06-22 (×3): 5 mg via INTRAVENOUS
  Filled 2024-06-22 (×3): qty 1

## 2024-06-22 MED ORDER — POTASSIUM CHLORIDE 10 MEQ/100ML IV SOLN
10.0000 meq | INTRAVENOUS | Status: AC
  Administered 2024-06-22 (×6): 10 meq via INTRAVENOUS
  Filled 2024-06-22 (×6): qty 100

## 2024-06-22 NOTE — Plan of Care (Signed)
  Problem: Clinical Measurements: Goal: Ability to maintain clinical measurements within normal limits will improve Outcome: Progressing Goal: Will remain free from infection Outcome: Progressing Goal: Respiratory complications will improve Outcome: Progressing Goal: Cardiovascular complication will be avoided Outcome: Progressing   Problem: Elimination: Goal: Will not experience complications related to bowel motility Outcome: Progressing Goal: Will not experience complications related to urinary retention Outcome: Progressing   Problem: Pain Managment: Goal: General experience of comfort will improve and/or be controlled Outcome: Progressing

## 2024-06-22 NOTE — Progress Notes (Signed)
" °  Progress Note   Patient: Alejandro Collier FMW:[id number] DOB: 11/16/1985 DOA: 06/19/2024     2 DOS: the patient was seen and examined on 06/22/2024   Brief hospital course: Partly taken from prior notes.   Alejandro Collier is a 39 y.o. male with medical history significant for Hypertension and alcohol use disorder being admitted with an alcohol withdrawal seizure, witnessed by his family.  Last drink was on 01/19, a day prior to admission.  On presentation patient was tachycardic, tachypneic and hypotensive.  Labs with leukocytosis of 14,000, EtOH<15, abnormal LFTs with AST/ALT 210/70, alk phos 203 and total bili 1.4 with normal lipase of 21. Anion gap 26 with bicarb of 20.  EKG with sinus tachycardia.   Patient was started on CIWA protocol with Ativan , IV hydration.  Rapid response was on 01/21 due to patient being agitated, requiring high doses of Ativan  for alcohol withdrawal.  Was started on phenobarbital  taper and on Precedex  gtt, transferred to stepdown   Assessment and Plan: 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, with unspecified complication Alcohol withdrawal seizure Likely secondary to alcohol withdrawal. Need to have brain MRI if there is any recurrence No antiseizure medications were given On phenobarbital  taper On Precedex  drip, Will consult PCCM if unable to wean in 24 hours NPO, IV fluids FA, MV, Thiamine  Continue CIWA protocol and wean Precedex  as able, goal RASS 0 to -1   Alcoholic liver disease Transaminitis, Alcoholic pattern Possible alcoholic ketoacidosis Slowly improving liver enzymes.  Likely secondary to significant alcohol intake. RUQ ultrasound shows cholelithiasis without evidence of cholecystitis.  Probable hepatic steatosis Trend LFT's   Essential hypertension Blood pressure elevated.  Patient was on a small dose of lisinopril  at home. Continuing home lisinopril  Add low-dose metoprolol  Continue to monitor   Hypokalemia Hypomagnesemia Monitor  phos Monitor and replete as needed   Leukocytosis Likely reactive on admission and has been resolved.   Thrombocytopenia Likely secondary to alcoholic liver disease. Continue to monitor   Subjective: Patient was sedated being on Precedex , will try to wean as able Tried calling mother, no answer -unable to leave voicemail.  No family members present at the bedside  Physical Exam: Vitals:   06/22/24 0330 06/22/24 0400 06/22/24 0500 06/22/24 0700  BP: 120/78 108/71 123/79 (!) 129/100  Pulse: 89 87 82 77  Resp: 11 12 11 10   Temp:      TempSrc:      SpO2: 98% 98% 98% 99%  Weight:      Height:       General.  Malnourished gentleman, in no acute distress. Pulmonary.  Lungs clear bilaterally, normal respiratory effort. CV.  Regular rate and rhythm, no JVD, rub or murmur. Abdomen.  Soft, nontender, nondistended, BS positive. CNS.  Sedated, moving all extremities Extremities.  No edema, no cyanosis, pulses intact and symmetrical.   Data Reviewed: Prior data reviewed  Family Communication: Tried calling mother with no response.  Disposition: Status is: Inpatient Remains inpatient appropriate because: Severity of illness  Planned Discharge Destination: Home  DVT prophylaxis.  Lovenox  Time spent: 50 minutes  Author: Laree Lock, MD 06/22/2024 8:08 AM  For on call review [email or web address].  "

## 2024-06-22 NOTE — Progress Notes (Signed)
 Rapid Response Event Note   Reason for Call: Agitation/ETOH Withdrawal  Initial Focused Assessment: Rapid response RN in to see patient actively getting out of bed/fighting with multiple staff members at the bedside. Patient admitted due to seizure and ETOH withdrawal. Prior to RN arrival, patient had received multiple doses of ativan  and phenobarbital . Security called due to risk of violence and harm to staff/himself. Rockie Rams, NP, in to see the patient. Patient actively confused and is difficult to redirect.  Plan of Care:  - Ativan  + phenobarbital  doses - Initiation of precedex  drip - Transfer to stepdown  Event Summary:   MD Notified: Rockie Rams, NP Call Time: 2358 Arrival Time: 0000 End Time: 9954  Arlean FORBES Bowers, RN

## 2024-06-22 NOTE — Plan of Care (Signed)

## 2024-06-23 DIAGNOSIS — K709 Alcoholic liver disease, unspecified: Secondary | ICD-10-CM

## 2024-06-23 DIAGNOSIS — D696 Thrombocytopenia, unspecified: Secondary | ICD-10-CM

## 2024-06-23 DIAGNOSIS — F10231 Alcohol dependence with withdrawal delirium: Secondary | ICD-10-CM

## 2024-06-23 DIAGNOSIS — I10 Essential (primary) hypertension: Secondary | ICD-10-CM

## 2024-06-23 DIAGNOSIS — R7401 Elevation of levels of liver transaminase levels: Secondary | ICD-10-CM

## 2024-06-23 DIAGNOSIS — E876 Hypokalemia: Secondary | ICD-10-CM

## 2024-06-23 LAB — CBC
HCT: 42.4 % (ref 39.0–52.0)
Hemoglobin: 14.2 g/dL (ref 13.0–17.0)
MCH: 29.6 pg (ref 26.0–34.0)
MCHC: 33.5 g/dL (ref 30.0–36.0)
MCV: 88.3 fL (ref 80.0–100.0)
Platelets: 189 K/uL (ref 150–400)
RBC: 4.8 MIL/uL (ref 4.22–5.81)
RDW: 14.9 % (ref 11.5–15.5)
WBC: 11.1 K/uL — ABNORMAL HIGH (ref 4.0–10.5)
nRBC: 0 % (ref 0.0–0.2)

## 2024-06-23 LAB — MAGNESIUM: Magnesium: 1.3 mg/dL — ABNORMAL LOW (ref 1.7–2.4)

## 2024-06-23 LAB — COMPREHENSIVE METABOLIC PANEL WITH GFR
ALT: 45 U/L — ABNORMAL HIGH (ref 0–44)
AST: 88 U/L — ABNORMAL HIGH (ref 15–41)
Albumin: 3.2 g/dL — ABNORMAL LOW (ref 3.5–5.0)
Alkaline Phosphatase: 154 U/L — ABNORMAL HIGH (ref 38–126)
Anion gap: 12 (ref 5–15)
BUN: <5 mg/dL — ABNORMAL LOW (ref 6–20)
CO2: 26 mmol/L (ref 22–32)
Calcium: 9.1 mg/dL (ref 8.9–10.3)
Chloride: 100 mmol/L (ref 98–111)
Creatinine, Ser: 0.53 mg/dL — ABNORMAL LOW (ref 0.61–1.24)
GFR, Estimated: >60 mL/min
Glucose, Bld: 92 mg/dL (ref 70–99)
Potassium: 3.9 mmol/L (ref 3.5–5.1)
Sodium: 138 mmol/L (ref 135–145)
Total Bilirubin: 1.1 mg/dL (ref 0.0–1.2)
Total Protein: 7.2 g/dL (ref 6.5–8.1)

## 2024-06-23 LAB — PHOSPHORUS: Phosphorus: 3.5 mg/dL (ref 2.5–4.6)

## 2024-06-23 MED ORDER — THIAMINE HCL 100 MG/ML IJ SOLN
250.0000 mg | INTRAVENOUS | Status: AC
  Administered 2024-06-25 – 2024-06-29 (×5): 250 mg via INTRAVENOUS
  Filled 2024-06-23 (×5): qty 2.5

## 2024-06-23 MED ORDER — FOLIC ACID 5 MG/ML IJ SOLN
1.0000 mg | Freq: Every day | INTRAMUSCULAR | Status: DC
  Administered 2024-06-23 – 2024-06-24 (×2): 1 mg via INTRAVENOUS
  Filled 2024-06-23 (×2): qty 0.2

## 2024-06-23 MED ORDER — PHENOBARBITAL SODIUM 130 MG/ML IJ SOLN
130.0000 mg | Freq: Once | INTRAMUSCULAR | Status: AC
  Administered 2024-06-23: 130 mg via INTRAVENOUS
  Filled 2024-06-23: qty 1

## 2024-06-23 MED ORDER — THIAMINE HCL 100 MG/ML IJ SOLN
100.0000 mg | INTRAMUSCULAR | Status: DC
  Administered 2024-06-30 – 2024-07-01 (×2): 100 mg via INTRAVENOUS
  Filled 2024-06-23 (×2): qty 2

## 2024-06-23 MED ORDER — HYDRALAZINE HCL 20 MG/ML IJ SOLN
10.0000 mg | Freq: Four times a day (QID) | INTRAMUSCULAR | Status: DC | PRN
  Administered 2024-06-23 – 2024-06-27 (×7): 10 mg via INTRAVENOUS
  Filled 2024-06-23 (×7): qty 1

## 2024-06-23 MED ORDER — PHENOBARBITAL SODIUM 130 MG/ML IJ SOLN: 130.0000 mg | Freq: Once | INTRAMUSCULAR | Status: DC

## 2024-06-23 MED ORDER — THIAMINE HCL 100 MG/ML IJ SOLN
500.0000 mg | Freq: Three times a day (TID) | INTRAVENOUS | Status: AC
  Administered 2024-06-23 – 2024-06-24 (×5): 500 mg via INTRAVENOUS
  Filled 2024-06-23 (×6): qty 5

## 2024-06-23 MED ORDER — MAGNESIUM SULFATE 4 GM/100ML IV SOLN
4.0000 g | Freq: Once | INTRAVENOUS | Status: AC
  Administered 2024-06-23: 4 g via INTRAVENOUS
  Filled 2024-06-23: qty 100

## 2024-06-23 MED ORDER — ZIPRASIDONE MESYLATE 20 MG IM SOLR
10.0000 mg | Freq: Once | INTRAMUSCULAR | Status: AC
  Administered 2024-06-23: 10 mg via INTRAMUSCULAR
  Filled 2024-06-23: qty 20

## 2024-06-23 NOTE — Plan of Care (Signed)
  Problem: Clinical Measurements: Goal: Diagnostic test results will improve Outcome: Progressing Goal: Respiratory complications will improve Outcome: Progressing Goal: Cardiovascular complication will be avoided Outcome: Progressing   Problem: Elimination: Goal: Will not experience complications related to bowel motility Outcome: Progressing Goal: Will not experience complications related to urinary retention Outcome: Progressing   Problem: Pain Managment: Goal: General experience of comfort will improve and/or be controlled Outcome: Progressing   Problem: Skin Integrity: Goal: Risk for impaired skin integrity will decrease Outcome: Progressing

## 2024-06-23 NOTE — Plan of Care (Signed)
 Pt very agitated, attempting to get OOB, pulling off leads and gown. Attempted to redirect patient back in bed. This RN and another RN attempting to verbally deescalate pt when pt became combative, swinging at staff. Laneta, NP paged and at the bedside. Security at the bedside. Given a one time order for 10 mg Geodon IM. Patient on precedex  gtt @1 .2 mcg/kg/hr.   Problem: Coping: Goal: Level of anxiety will decrease Outcome: Progressing   Problem: Education: Goal: Knowledge of General Education information will improve Description: Including pain rating scale, medication(s)/side effects and non-pharmacologic comfort measures Outcome: Not Progressing   Problem: Safety: Goal: Ability to remain free from injury will improve Outcome: Not Progressing

## 2024-06-23 NOTE — Progress Notes (Signed)
"  ° °      Overnight   NAME: LC JOYNT MRN: [id number] DOB : [birth year]    Date of Service   06/23/2024   HPI/Events of Note   HPI: 39 year old male with past medical history significant for hypertension alcohol use disorder who was admitted with alcohol withdrawal and seizure witnessed by his family last drink was on 1/19.  On presentation patient was tachycardic, tachypneic and hypotensive labs with leukocytosis of 14,000 and EtOH less than 15 abnormal LFTs with AST/ALT 210/70 alk phos 203 and total bili 1.4 with EKG with sinus tachycardia.  Patient was started on a CIWA protocol with Ativan  IV hydration rapid response on a 1/2 1 due to patient being agitated requiring high doses of Ativan  for alcohol withdraw was then started on phenobarbital  taper and on a Precedex  drip transferred to stepdown   Overnight: Notified by RN that patient is experiencing extreme agitation requiring security to restrain patient.  Patient has been receiving Precedex  at maximum dose, Ativan  as needed for CIWA as well as phenobarbital .  Ordered Geodon  for acute agitation.  Consulted ICU given chart review reveals patient has been on Precedex  for 26 hours     Interventions/ Plan   Consult ICU IM Geodon  once for acute agitation    Updates     Shakirra Buehler Donati- Aram BSN RN CCRN AGACNP-BC Acute Care Nurse Practitioner [facility] [place]  "

## 2024-06-23 NOTE — Progress Notes (Signed)
 Initial Nutrition Assessment  DOCUMENTATION CODES:   Not applicable  INTERVENTION:   RD will add supplements with diet advancement   Recommend dobhoff tube placement and nutrition support if pt remains NPO   If feeding tube placed:  Osmolite 1.5@65ml /hr- Initiate at 25ml/hr and increase by 10ml/hr q 8 hours until goal rate is reached.   ProSource TF 20- Give 60ml daily via tube, each supplement provides 80kcal and 20g of protein.   Free water flushes 30ml q4 hours to maintain tube patency   Regimen provides 2420kcal/day, 118g/day protein and 1349ml/day of free water.   Continue high dose thiamine , folic acid  and MVI daily   Pt at high refeed risk; recommend monitor potassium, magnesium  and phosphorus labs daily until stable  Daily weights   NUTRITION DIAGNOSIS:   Inadequate oral intake related to lethargy/confusion as evidenced by NPO status.  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  Visited pt's room today. Pt unable to provide any history secondary to AMS. RD will add supplements with diet advancement. Would recommend dobhoff tube placement and nutrition support if pt remains NPO over the next 48 hrs. Pt is at high refeed risk. Per chart, pt appears weight stable at baseline. Pt is at high risk for developing malnutrition.    Medications reviewed and include: lovenox , folic acid , MVI, thiamine , LRS @100ml /hr  Labs reviewed: K 3.9 wnl, BUN <5(L), creat 0.53(L), P 3.5 wnl, Mg 1.3(L) Wbc- 11.1(H)  IOP-   NUTRITION - FOCUSED PHYSICAL EXAM:  Flowsheet Row Most Recent Value  Orbital Region  No depletion  Upper Arm Region Mild depletion  Thoracic and Lumbar Region No depletion  Buccal Region No depletion  Temple Region Mild depletion  Clavicle Bone Region Moderate depletion  Clavicle and Acromion Bone Region Moderate depletion  Scapular Bone Region No depletion  Dorsal Hand No depletion  Patellar Region Severe depletion  Anterior Thigh Region Severe depletion  Posterior Calf Region Severe depletion  Edema (RD Assessment) None  Hair Reviewed  Eyes Reviewed  Mouth Reviewed  Skin Reviewed  Nails Reviewed   Diet Order:   Diet Order             Diet NPO time specified  Diet effective midnight                  EDUCATION NEEDS:   No education needs have been identified at this time  Skin:  Skin Assessment: Reviewed RN Assessment  Last BM:  1/21- type 7  Height:   Ht Readings from Last 1 Encounters:  06/19/24 5' 10 (1.778 m)    Weight:   Wt Readings from Last 1 Encounters:  06/19/24 68 kg    Ideal Body Weight:  75.4 kg  BMI:  Body mass index is 21.52 kg/m.  Estimated Nutritional Needs:   Kcal:  2100-2400kcal/day  Protein:  105-120g/day  Fluid:  2.1-2.4L/day  Augustin Shams MS, RD, LDN If unable to be reached, please send secure chat to RD inpatient available from 8:00a-4:00p daily

## 2024-06-23 NOTE — Consult Note (Signed)
 "  NAME:  Alejandro Collier, MRN:  [id number], DOB:  26-Aug-1985, LOS: 3 ADMISSION DATE:  06/19/2024, CONSULTATION DATE:  06/23/24 REFERRING MD:  Dr. Jerelene, CHIEF COMPLAINT:  Seizures    Brief Synopsis:  39 year old male with a past medical history significant for hypertension, renal disorder (IGA/MCD Syndrome), GERD, chronic dyspnea and alcohol abuse, admitted for seizure secondary to alcohol withdrawal.  History of Present Illness:  Alejandro Collier is a 39 year old male with past medical history significant for hypertension, GERD, renal disorder, chronic dyspnea and alcohol use disorder admitted with alcohol withdrawal and seizure witnessed while he was at work. It was reported his last drink was on 1/19.  He does have a history of seizures with alcohol withdrawal over a year ago. He denied significant changes to his amount of alcohol intake prior to arrival.   ED Course: Upon arrival via EMS, it was reported he had one episode of vomiting en route. On presentation patient was tachycardic, tachypneic and hypotensive. He denied symptoms of nausea/vomiting, diarrhea or abdominal pain. Once arrived to the ED, he was acutely agitated requiring multiple doses of ativan  and started on IV fluids  Vitals on Arrival: Temp: 97.6 BP: 155/101  HR: 104 RR: 28 SpO2: 98% on room air  Pertinent Labs/Diagnostics: Notable for leukocytosis, EtOH <15, transaminitis, hyperbilirubinemia and metabolic acidosis. No electrolyte derangements  I, Robet Kim, PA-C personally viewed and interpreted this ECG. EKG Interpretation: Date: 1/19, EKG Time: 2230, Rate: 86, Rhythm: normal, QRS Axis:  normal Intervals: nonspecific IVCD, ST/T Wave abnormalities: nonspecific abnormalities, Narrative Interpretation: no evidence of acute ischemia  Chemistry:  Na+:138 K+: 3.6 BUN/Cr.: 7/0.80 Serum CO2/ AG: 20/26 Glucose: 126 AST/ALT: 210/70  Hematology:  WBC: 14.2 Hgb: 13.8 Platelets 129 INR: 1.0  ID: MRSA PCR:  negative  Medications Administered:  IVF: 1L NaCl Thiamine  Zofran  4mg  Lorazepam    Hospital Course: See below listed under significant hospital events  Most Recent Significant Labs: K: 3.4 Glucose: 128 Cr: 0.52 Alk Phos: 148 AST/ALT: 118/48 Hgb: 12.3 Platelets: 129   Pertinent Medical History  Alcohol Use Disorder Hypertension Renal Disorder GERD   Significant Hospital Events: Including procedures, antibiotic start and stop dates in addition to other pertinent events   1/19: Admitted following a witnessed seizure at home in the setting of alcohol withdrawal. Started on phenobarbital  taper with ativan  prn 1/20: Remained agitated with phenobarbital  and prn ativan  1/21: rapid response due to patient being agitated requiring high doses of Ativan  for alcohol withdraw was then started on phenobarbital  taper and on a Precedex  drip transferred to stepdown  1/22: Remained on precedex , phenobarb and prn ativan  with agitation and combativeness. 1/23: Still on precedex  gtt with acute agitation with spells of violence. PCCM consulted d/t precedex   Interim History / Subjective:  Remains on precedex  gtt with intermittent agitation and combativeness. Also on phenobarbital  and ativan  prn. Geodon  ordered   Objective    Blood pressure (!) 164/115, pulse 74, temperature 98.5 F (36.9 C), temperature source Axillary, resp. rate 12, height 5' 10 (1.778 m), weight 68 kg, SpO2 95%.        Intake/Output Summary (Last 24 hours) at 06/23/2024 9660 Last data filed at 06/23/2024 0000 Gross per 24 hour  Intake 2840.81 ml  Output 3650 ml  Net -809.19 ml   Filed Weights   06/19/24 2223  Weight: 68 kg    Examination: General: Adult male, lying in bed with intermittent agitation HENT: anicteric sclerae, atraumatic, normocephalic, neck supple, no JVD CV: RRR,  S1 S2, sinus tach on monitor, no r/m/g Pulm: Regular, non labored on room air, breath sounds equal throughout, no  wheezing/rales/rhonchi GI: soft, non-tender, non-distended, no rebound/guarding, bowel sounds x 4 Extremities: warm/dry, pulses + 2 R/P, no edema noted Neuro: a & O x 2 (self and time) but agitated, intermittently follows commands, no focal neuro deficits, PERRL  GU: deferred   Resolved problem list   Assessment and Plan   #Seizure due to Alcohol Withdrawal - CIWA protocol - Continue precedex  gtt - Wean precedex  as able - Phenobarb taper - PRN ativan  - Geodon  x 1 - HD thiamine  with folate - Daily MVI - Seizure precautions - Consider imaging with seizure recurrence  #Hypertension - Continuous cardiac monitoring - Vasopressors to maintain MAP >65 ~ not currently requiring - Antihypertensives to maintain SBP <160 - Continue Lisinopril  5mg  daily - Metoprolol  12.5mg  BID - Hydralazine  10mg  q6h prn  #Alcoholic Liver Injury/Disease #Transaminitis RUQ: cholelithiasis w/o evidence of cholecystitis; probably steatosis - Trend hepatic function and monitor INR - AST/ALT trending down - Alk Phos trending down - Diet: NPO - Constipation protocol prn - Zofran  and Phenergan  for nausea/vomiting  #AGMA iso Alcoholic Ketoacidosis ~ Resolved #Hypokalemia  - Trend BMP - Monitor I/O - Avoid nephrotoxins as able - Ensure adequate renal perfusion - Continue IVF - Replace electrolytes as indicated  #Thrombocytopenia #VTE Prophylaxis - Trend CBC - Transfuse if Hgb < 7 or platelets < 7 or active bleeding - Monitor for s/sx of bleeding - DVT prophylaxis: Lovenox , SCDs  #Leukocytosis ~ Resolved - Likely reactive given condition - Trend WBC and monitor fever curve - Hold off on abx for now  Labs   CBC: Recent Labs  Lab 06/19/24 2235 06/21/24 0441 06/22/24 0230  WBC 14.2* 8.6 8.2  NEUTROABS 11.3*  --   --   HGB 13.8 12.8* 12.3*  HCT 41.9 37.3* 37.4*  MCV 88.6 85.4 87.6  PLT 129* 104* 129*    Basic Metabolic Panel: Recent Labs  Lab 06/19/24 2312 06/21/24 0441  06/22/24 0230  NA 138 137 139  K 3.6 2.7* 3.4*  CL 93* 93* 98  CO2 20* 31 30  GLUCOSE 126* 91 128*  BUN 7 7 7   CREATININE 0.80 0.58* 0.52*  CALCIUM 8.9 8.4* 8.5*  MG  --  0.9* 1.7  PHOS  --  3.4 4.1   GFR: Estimated Creatinine Clearance: 120.4 mL/min (A) (by C-G formula based on SCr of 0.52 mg/dL (L)). Recent Labs  Lab 06/19/24 2235 06/21/24 0441 06/22/24 0230  WBC 14.2* 8.6 8.2    Liver Function Tests: Recent Labs  Lab 06/19/24 2312 06/21/24 0441 06/22/24 0230  AST 210* 110* 118*  ALT 70* 45* 48*  ALKPHOS 203* 152* 148*  BILITOT 1.4* 1.4* 1.0  PROT 7.8 6.7 6.7  ALBUMIN 3.6 3.1* 3.3*   Recent Labs  Lab 06/19/24 2312  LIPASE 21   No results for input(s): AMMONIA in the last 168 hours.  ABG No results found for: PHART, PCO2ART, PO2ART, HCO3, TCO2, ACIDBASEDEF, O2SAT   Coagulation Profile: Recent Labs  Lab 06/21/24 0441  INR 1.0    Cardiac Enzymes: No results for input(s): CKTOTAL, CKMB, CKMBINDEX, TROPONINI in the last 168 hours.  HbA1C: No results found for: HGBA1C  CBG: Recent Labs  Lab 06/22/24 0039  GLUCAP 116*    Review of Systems:   Review of Systems  Respiratory:  Negative for cough and shortness of breath.   Cardiovascular:  Negative for chest pain and palpitations.  Gastrointestinal:  Negative for abdominal pain, diarrhea, nausea and vomiting.  Neurological:  Positive for tremors. Negative for weakness and headaches.  Psychiatric/Behavioral:  The patient is nervous/anxious.      Past Medical History:  He,  has a past medical history of Alopecia, Dyspnea, GERD (gastroesophageal reflux disease), Hypertension, and Renal disorder.   Surgical History:   Past Surgical History:  Procedure Laterality Date   ORIF ANKLE FRACTURE Right 07/30/2017   Procedure: OPEN REDUCTION INTERNAL FIXATION (ORIF) ANKLE FRACTURE;  Surgeon: Tobie Priest, MD;  Location: [facility] ORS;  Service: Orthopedics;  Laterality: Right;    SPLENECTOMY  2008   SPLENECTOMY     INJURED PLAYING SPORTS AND HAD INTERNAL BLEEDING   SYNDESMOSIS REPAIR Right 07/30/2017   Procedure: POSSIBLE SYNDESMOSIS REPAIR;  Surgeon: Tobie Priest, MD;  Location: [facility] ORS;  Service: Orthopedics;  Laterality: Right;   WISDOM TOOTH EXTRACTION       Social History:   reports that he has never smoked. He has never used smokeless tobacco. He reports current alcohol use of about 21.0 standard drinks of alcohol per week. He reports current drug use. Drug: Other-see comments.   Family History:  His family history is not on file.   Allergies Allergies[1]   Home Medications  Prior to Admission medications  Medication Sig Start Date End Date Taking? Authorizing Provider  lisinopril  (ZESTRIL ) 5 MG tablet Take 1 tablet (5 mg total) by mouth daily. 11/11/21 11/11/22  Menshew, Candida LULLA Kings, PA-C     Critical care time: 45 minutes       Robet Kim, PA-C [facility] PCCM Team Contact Info: [phone]       [1]  Allergies Allergen Reactions   Penicillins    "

## 2024-06-23 NOTE — Progress Notes (Signed)
 PHARMACY CONSULT NOTE - FOLLOW UP  Pharmacy Consult for Electrolyte Monitoring and Replacement   Recent Labs: Potassium (mmol/L)  Date Value  06/23/2024 3.9  02/20/2013 4.1   Magnesium  (mg/dL)  Date Value  98/76/7973 1.3 (L)   Calcium (mg/dL)  Date Value  98/76/7973 9.1   Calcium, Total (mg/dL)  Date Value  90/77/7985 8.6   Albumin (g/dL)  Date Value  98/76/7973 3.2 (L)   Phosphorus (mg/dL)  Date Value  98/76/7973 3.5   Sodium (mmol/L)  Date Value  06/23/2024 138  02/20/2013 137     Assessment: 39 y.o. male with medical history significant for Hypertension and alcohol use disorder being admitted with an alcohol withdrawal seizure, witnessed by his family. Pharmacy is asked to follow and replace electrolytes while in CCU  Goal of Therapy:  Electrolytes WNL  Plan:  --4 grams IV magnesium  sulfate x 1 --recheck electrolytes in am  Adriana JONETTA Bolster ,PharmD Clinical Pharmacist 06/23/2024 7:49 AM

## 2024-06-24 ENCOUNTER — Inpatient Hospital Stay: Payer: Self-pay

## 2024-06-24 DIAGNOSIS — J9601 Acute respiratory failure with hypoxia: Secondary | ICD-10-CM

## 2024-06-24 DIAGNOSIS — D72829 Elevated white blood cell count, unspecified: Secondary | ICD-10-CM

## 2024-06-24 DIAGNOSIS — G928 Other toxic encephalopathy: Secondary | ICD-10-CM

## 2024-06-24 LAB — BLOOD GAS, ARTERIAL
Acid-Base Excess: 2 mmol/L (ref 0.0–2.0)
Bicarbonate: 27.3 mmol/L (ref 20.0–28.0)
FIO2: 40 %
MECHVT: 450 mL
Mechanical Rate: 16
O2 Saturation: 98.6 %
PEEP: 5 cmH2O
Patient temperature: 37
pCO2 arterial: 44 mmHg (ref 32–48)
pH, Arterial: 7.4 (ref 7.35–7.45)
pO2, Arterial: 96 mmHg (ref 83–108)

## 2024-06-24 LAB — COMPREHENSIVE METABOLIC PANEL WITH GFR
ALT: 28 U/L (ref 0–44)
AST: 50 U/L — ABNORMAL HIGH (ref 15–41)
Albumin: 2.7 g/dL — ABNORMAL LOW (ref 3.5–5.0)
Alkaline Phosphatase: 140 U/L — ABNORMAL HIGH (ref 38–126)
Anion gap: 13 (ref 5–15)
BUN: 8 mg/dL (ref 6–20)
CO2: 23 mmol/L (ref 22–32)
Calcium: 7.7 mg/dL — ABNORMAL LOW (ref 8.9–10.3)
Chloride: 103 mmol/L (ref 98–111)
Creatinine, Ser: 0.83 mg/dL (ref 0.61–1.24)
GFR, Estimated: >60 mL/min
Glucose, Bld: 91 mg/dL (ref 70–99)
Potassium: 3.4 mmol/L — ABNORMAL LOW (ref 3.5–5.1)
Sodium: 140 mmol/L (ref 135–145)
Total Bilirubin: 0.6 mg/dL (ref 0.0–1.2)
Total Protein: 5.9 g/dL — ABNORMAL LOW (ref 6.5–8.1)

## 2024-06-24 LAB — CBC
HCT: 35.4 % — ABNORMAL LOW (ref 39.0–52.0)
Hemoglobin: 11.5 g/dL — ABNORMAL LOW (ref 13.0–17.0)
MCH: 29.3 pg (ref 26.0–34.0)
MCHC: 32.5 g/dL (ref 30.0–36.0)
MCV: 90.1 fL (ref 80.0–100.0)
Platelets: 284 10*3/uL (ref 150–400)
RBC: 3.93 MIL/uL — ABNORMAL LOW (ref 4.22–5.81)
RDW: 15.5 % (ref 11.5–15.5)
WBC: 12.2 10*3/uL — ABNORMAL HIGH (ref 4.0–10.5)
nRBC: 0 % (ref 0.0–0.2)

## 2024-06-24 LAB — MAGNESIUM: Magnesium: 1.6 mg/dL — ABNORMAL LOW (ref 1.7–2.4)

## 2024-06-24 LAB — GLUCOSE, CAPILLARY
Glucose-Capillary: 107 mg/dL — ABNORMAL HIGH (ref 70–99)
Glucose-Capillary: 106 mg/dL — ABNORMAL HIGH (ref 70–99)

## 2024-06-24 MED ORDER — SENNA 8.6 MG PO TABS
1.0000 | ORAL_TABLET | Freq: Two times a day (BID) | ORAL | Status: DC
  Administered 2024-06-24 – 2024-06-26 (×5): 8.6 mg
  Filled 2024-06-24 (×5): qty 1

## 2024-06-24 MED ORDER — ORAL CARE MOUTH RINSE
15.0000 mL | OROMUCOSAL | Status: DC
  Administered 2024-06-24 – 2024-06-26 (×23): 15 mL via OROMUCOSAL

## 2024-06-24 MED ORDER — ONDANSETRON HCL 4 MG PO TABS: 4.0000 mg | ORAL_TABLET | Freq: Four times a day (QID) | ORAL | Status: DC | PRN

## 2024-06-24 MED ORDER — POTASSIUM CHLORIDE 20 MEQ PO PACK
40.0000 meq | PACK | Freq: Two times a day (BID) | ORAL | Status: AC
  Administered 2024-06-24 (×2): 40 meq
  Filled 2024-06-24 (×2): qty 2

## 2024-06-24 MED ORDER — ROCURONIUM BROMIDE 10 MG/ML (PF) SYRINGE: 50.0000 mg | PREFILLED_SYRINGE | Freq: Once | INTRAVENOUS | Status: AC

## 2024-06-24 MED ORDER — ETOMIDATE 2 MG/ML IV SOLN: 10.0000 mg | Freq: Once | INTRAVENOUS | Status: AC

## 2024-06-24 MED ORDER — MIDAZOLAM HCL (PF) 2 MG/2ML IJ SOLN: 1.0000 mg | INTRAMUSCULAR | Status: DC | PRN

## 2024-06-24 MED ORDER — FENTANYL CITRATE (PF) 50 MCG/ML IJ SOSY
50.0000 ug | PREFILLED_SYRINGE | INTRAMUSCULAR | Status: DC | PRN
  Administered 2024-06-24 – 2024-06-25 (×8): 100 ug via INTRAVENOUS
  Administered 2024-06-25: 50 ug via INTRAVENOUS
  Administered 2024-06-25 – 2024-06-26 (×5): 100 ug via INTRAVENOUS
  Filled 2024-06-24 (×2): qty 2
  Filled 2024-06-24: qty 1
  Filled 2024-06-24 (×12): qty 2

## 2024-06-24 MED ORDER — VITAL HP 1.0 CAL PO LIQD
1000.0000 mL | ORAL | Status: AC
  Administered 2024-06-24: 1000 mL

## 2024-06-24 MED ORDER — ETOMIDATE 2 MG/ML IV SOLN
INTRAVENOUS | Status: AC
  Administered 2024-06-24: 10 mg via INTRAVENOUS
  Filled 2024-06-24: qty 20

## 2024-06-24 MED ORDER — POLYETHYLENE GLYCOL 3350 17 G PO PACK
17.0000 g | PACK | Freq: Every day | ORAL | Status: DC
  Administered 2024-06-24 – 2024-06-26 (×3): 17 g
  Filled 2024-06-24 (×3): qty 1

## 2024-06-24 MED ORDER — ONDANSETRON HCL 4 MG/2ML IJ SOLN: 4.0000 mg | Freq: Four times a day (QID) | INTRAMUSCULAR | Status: DC | PRN

## 2024-06-24 MED ORDER — FOLIC ACID 1 MG PO TABS
1.0000 mg | ORAL_TABLET | Freq: Every day | ORAL | Status: DC
  Administered 2024-06-25 – 2024-06-26 (×2): 1 mg
  Filled 2024-06-24 (×2): qty 1

## 2024-06-24 MED ORDER — PROSOURCE TF20 ENFIT COMPATIBL EN LIQD
60.0000 mL | Freq: Every day | ENTERAL | Status: DC
  Administered 2024-06-24 – 2024-06-26 (×3): 60 mL
  Filled 2024-06-24: qty 60

## 2024-06-24 MED ORDER — ROCURONIUM BROMIDE 10 MG/ML (PF) SYRINGE
PREFILLED_SYRINGE | INTRAVENOUS | Status: AC
  Administered 2024-06-24: 50 mg via INTRAVENOUS
  Filled 2024-06-24: qty 10

## 2024-06-24 MED ORDER — SODIUM CHLORIDE 0.9 % IV BOLUS
1000.0000 mL | Freq: Once | INTRAVENOUS | Status: AC
  Administered 2024-06-24: 1000 mL via INTRAVENOUS

## 2024-06-24 MED ORDER — OSMOLITE 1.5 CAL PO LIQD
1000.0000 mL | ORAL | Status: DC
  Administered 2024-06-25 – 2024-06-26 (×2): 1000 mL

## 2024-06-24 MED ORDER — PROPOFOL 1000 MG/100ML IV EMUL
0.0000 ug/kg/min | INTRAVENOUS | Status: DC
  Administered 2024-06-24: 70 ug/kg/min via INTRAVENOUS
  Administered 2024-06-24: 60 ug/kg/min via INTRAVENOUS
  Administered 2024-06-24: 50 ug/kg/min via INTRAVENOUS
  Administered 2024-06-24: 10 ug/kg/min via INTRAVENOUS
  Administered 2024-06-24: 50 ug/kg/min via INTRAVENOUS
  Administered 2024-06-25 (×2): 70 ug/kg/min via INTRAVENOUS
  Administered 2024-06-25: 60 ug/kg/min via INTRAVENOUS
  Administered 2024-06-25: 70 ug/kg/min via INTRAVENOUS
  Administered 2024-06-25: 40 ug/kg/min via INTRAVENOUS
  Administered 2024-06-25: 80 ug/kg/min via INTRAVENOUS
  Administered 2024-06-26: 60 ug/kg/min via INTRAVENOUS
  Administered 2024-06-26 (×4): 70 ug/kg/min via INTRAVENOUS
  Filled 2024-06-24 (×16): qty 100

## 2024-06-24 MED ORDER — ACETAMINOPHEN 650 MG RE SUPP: 650.0000 mg | Freq: Four times a day (QID) | RECTAL | Status: DC | PRN

## 2024-06-24 MED ORDER — ORAL CARE MOUTH RINSE: 15.0000 mL | OROMUCOSAL | Status: DC | PRN

## 2024-06-24 MED ORDER — ACETAMINOPHEN 325 MG PO TABS
650.0000 mg | ORAL_TABLET | Freq: Four times a day (QID) | ORAL | Status: DC | PRN
  Administered 2024-06-25 – 2024-06-26 (×4): 650 mg
  Filled 2024-06-24 (×4): qty 2

## 2024-06-24 MED ORDER — ADULT MULTIVITAMIN W/MINERALS CH
1.0000 | ORAL_TABLET | Freq: Every day | ORAL | Status: DC
  Administered 2024-06-25 – 2024-06-26 (×2): 1
  Filled 2024-06-24 (×2): qty 1

## 2024-06-24 MED ORDER — MAGNESIUM SULFATE 4 GM/100ML IV SOLN
4.0000 g | Freq: Once | INTRAVENOUS | Status: AC
  Administered 2024-06-24: 4 g via INTRAVENOUS
  Filled 2024-06-24: qty 100

## 2024-06-24 NOTE — Progress Notes (Addendum)
 Brief Overnight Report  S: Intubated, sedated. Unable to assess.  O: Overnight pt became increasingly agitated and combative despite ? dexmedetomidine . Received additional 1x phenobarbital  w/ improved agitation, then developed gurgling/sonorous respirations, was difficult to arouse and unable to clear secretions ? c/f inability to protect airway. Emergent intubation performed. Currently hemodynamically stable on MV.  A: 30M w/ severe AUD admitted for DTs and EtOH withdrawal seizure, now s/p emergent intubation for airway protection after worsening agitation/sedative effect.  P: -MV for airway protection; confirm ETT, ABG/CXR PRN -Sedation per protocol; target RASS -Continue phenobarbital -based DT management; avoid benzos -Seizure precautions -kEEP NPO. NGT to suction -surgery consult for sbo -Maintain K >4, Mg >2 -DVT + stress ulcer ppx -Thiamine /folate/MVI     Almarie Nose DNP, CCRN, FNP-C, AGACNP-BC Acute Care & Family Nurse Practitioner [place] PCCM on call pager [phone]

## 2024-06-24 NOTE — Progress Notes (Signed)
 Pt's aggression increasing overnight requiring multiple PRN meds.   After sedating patient secretions increased rapidly. Lung sounds worsening. NP Ouma notifiied.  NP at bedside recommended intubation to protect airway.  Pt tolerated intubation procedure well.

## 2024-06-24 NOTE — Progress Notes (Signed)
 Pt ETT advanced to 26cm per NP order

## 2024-06-24 NOTE — Progress Notes (Signed)
 PHARMACY CONSULT NOTE  Pharmacy Consult for Electrolyte Monitoring and Replacement   Recent Labs: Potassium (mmol/L)  Date Value  06/24/2024 3.4 (L)  02/20/2013 4.1   Magnesium  (mg/dL)  Date Value  98/75/7973 1.6 (L)   Calcium (mg/dL)  Date Value  98/75/7973 7.7 (L)   Calcium, Total (mg/dL)  Date Value  90/77/7985 8.6   Albumin (g/dL)  Date Value  98/75/7973 2.7 (L)   Phosphorus (mg/dL)  Date Value  98/76/7973 3.5   Sodium (mmol/L)  Date Value  06/24/2024 140  02/20/2013 137   Assessment: 39 y.o. male with medical history significant for Hypertension and alcohol use disorder being admitted with an alcohol withdrawal seizure, witnessed by his family. Pharmacy is asked to follow and replace electrolytes while in CCU  Goal of Therapy:  Electrolytes WNL  Plan:  --K 3.4, Kcl 40 mEq per tube BID x 2 doses --Mg 1.6, magnesium  sulfate 4 g IV x 1 --Re-check electrolytes in AM  Marolyn KATHEE Mare 06/24/2024 6:58 AM

## 2024-06-24 NOTE — Procedures (Signed)
 INTUBATION PROCEDURE NOTE  Alejandro Collier  [id number]  1985/08/05  Date:06/24/24  Time:6:24 AM   Provider Performing:Latandra Loureiro A Ervin Rothbauer   Procedure: Intubation (31500)  Indication(s) Respiratory Failure  Consent Unable to obtain consent due to emergent nature of procedure.  Anesthesia Etomidate  and Rocuronium   Time Out Verified patient identification, verified procedure, site/side was marked, verified correct patient position, special equipment/implants available, medications/allergies/relevant history reviewed, required imaging and test results available.  Sterile Technique Usual hand hygeine, masks, and gloves were used  Procedure Description Patient positioned in bed supine.  Sedation given as noted above.  Patient was intubated with endotracheal tube using Glidescope.  View was Grade 1 full glottis .  Number of attempts was 1.  Colorimetric CO2 detector was consistent with tracheal placement.  Complications/Tolerance None; patient tolerated the procedure well. Chest X-ray is ordered to verify placement.  EBL Minimal  Specimen(s) None     Alejandro Nose DNP, CCRN, FNP-C, AGACNP-BC Acute Care & Family Nurse Practitioner  [facility] Medicine PCCM on call pager [phone]

## 2024-06-24 NOTE — Consult Note (Signed)
 "  NAME:  Alejandro Collier, MRN:  [id number], DOB:  [birth year], LOS: 4 ADMISSION DATE:  06/19/2024, CONSULTATION DATE:  06/23/24 REFERRING MD:  Dr. Jerelene, CHIEF COMPLAINT:  Seizures    Brief Synopsis:  39 year old male with a past medical history significant for hypertension, renal disorder (IGA/MCD Syndrome), GERD, chronic dyspnea and alcohol abuse, admitted for seizure secondary to alcohol withdrawal.  History of Present Illness:  Alejandro Collier is a 39 year old male with past medical history significant for hypertension, GERD, renal disorder, chronic dyspnea and alcohol use disorder admitted with alcohol withdrawal and seizure witnessed while he was at work. It was reported his last drink was on 1/19.  He does have a history of seizures with alcohol withdrawal over a year ago. He denied significant changes to his amount of alcohol intake prior to arrival.   ED Course: Upon arrival via EMS, it was reported he had one episode of vomiting en route. On presentation patient was tachycardic, tachypneic and hypotensive. He denied symptoms of nausea/vomiting, diarrhea or abdominal pain. Once arrived to the ED, he was acutely agitated requiring multiple doses of ativan  and started on IV fluids  Vitals on Arrival: Temp: 97.6 BP: 155/101  HR: 104 RR: 28 SpO2: 98% on room air  Pertinent Labs/Diagnostics: Notable for leukocytosis, EtOH <15, transaminitis, hyperbilirubinemia and metabolic acidosis. No electrolyte derangements  EKG Interpretation: SR, atypical RBBB, LVH  Chemistry:  Na+:138 K+: 3.6 BUN/Cr.: 7/0.80 Serum CO2/ AG: 20/26 Glucose: 126 AST/ALT: 210/70  Hematology:  WBC: 14.2 Hgb: 13.8 Platelets 129 INR: 1.0  ID: MRSA PCR: negative  Medications Administered:  IVF: 1L NaCl Thiamine  Zofran  4mg  Lorazepam    Hospital Course: See below listed under significant hospital events   Pertinent Medical History  Alcohol Use Disorder Hypertension Renal Disorder GERD    Significant Hospital Events: Including procedures, antibiotic start and stop dates in addition to other pertinent events   1/19: Admitted following a witnessed seizure at home in the setting of alcohol withdrawal. Started on phenobarbital  taper with ativan  prn 1/20: Remained agitated with phenobarbital  and prn ativan  1/21: rapid response due to patient being agitated requiring high doses of Ativan  for alcohol withdraw was then started on phenobarbital  taper and on a Precedex  drip transferred to stepdown  1/22: Remained on precedex , phenobarb and prn ativan  with agitation and combativeness. 1/23: Still on precedex  gtt with acute agitation with spells of violence. PCCM consulted d/t precedex . Intubated for inability to protect airway after receiving phenobarbital  for increasing agitation overnight.  Interim History / Subjective:  On propofol  gtt, RASS  -1.   Objective    Blood pressure (!) 89/59, pulse 87, temperature 98 F (36.7 C), temperature source Axillary, resp. rate 16, height 5' 10 (1.778 m), weight 65 kg, SpO2 100%.    Vent Mode: PRVC FiO2 (%):  [30 %-40 %] 30 % Set Rate:  [16 bmp] 16 bmp Vt Set:  [450 mL] 450 mL PEEP:  [5 cmH20] 5 cmH20 Plateau Pressure:  [15 cmH20] 15 cmH20   Intake/Output Summary (Last 24 hours) at 06/24/2024 0838 Last data filed at 06/24/2024 9362 Gross per 24 hour  Intake 4130.5 ml  Output 1630 ml  Net 2500.5 ml   Filed Weights   06/19/24 2223 06/24/24 0500  Weight: 68 kg 65 kg    Examination: General: Adult male, sedated HENT: anicteric sclerae, atraumatic, normocephalic, neck supple, no JVD CV: RRR, S1 S2, sinus tach on monitor, no r/m/g Pulm: synchronous with vent, breath sounds equal and clear  throughout, no wheezing/rales/rhonchi GI: soft, non-tender, non-distended, no rebound/guarding, bowel sounds x 4 Extremities: warm/dry, pulses + 2 R/P, no edema noted Neuro: sedated, PERRL  GU: foley with dark yellow urine   Resolved problem list    Assessment and Plan   #Seizure due to Alcohol Withdrawal - Continue propofol  gtt - PRN ativan  - HD thiamine  with folate - Daily MVI - Seizure precautions - Consider imaging with seizure recurrence  #Hypertension - Continuous cardiac monitoring - Vasopressors to maintain MAP >65 ~ not currently requiring - Antihypertensives to maintain SBP <160 - holding Lisinopril  and Metoprolol  - Hydralazine  10mg  q6h prn  #Alcoholic Liver Injury/Disease #Transaminitis (improving) RUQ: cholelithiasis w/o evidence of cholecystitis; probably steatosis - Trend hepatic function and monitor INR - AST/ALT trending down - Alk Phos trending down - Diet: NPO - Constipation protocol prn - Zofran  and Phenergan  for nausea/vomiting  #AGMA iso Alcoholic Ketoacidosis ~ Resolved #Hypokalemia  - Trend BMP - Monitor I/O - Avoid nephrotoxins as able - Ensure adequate renal perfusion - Continue IVF - Replace electrolytes as indicated  #Thrombocytopenia #VTE Prophylaxis - Trend CBC - Transfuse if Hgb < 7 or platelets < 7 or active bleeding - Monitor for s/sx of bleeding - DVT prophylaxis: Lovenox , SCDs  #Leukocytosis  - Likely reactive given condition - Trend WBC and monitor fever curve - Hold off on abx for now  Labs   CBC: Recent Labs  Lab 06/19/24 2235 06/21/24 0441 06/22/24 0230 06/23/24 0421 06/24/24 0411  WBC 14.2* 8.6 8.2 11.1* 12.2*  NEUTROABS 11.3*  --   --   --   --   HGB 13.8 12.8* 12.3* 14.2 11.5*  HCT 41.9 37.3* 37.4* 42.4 35.4*  MCV 88.6 85.4 87.6 88.3 90.1  PLT 129* 104* 129* 189 284    Basic Metabolic Panel: Recent Labs  Lab 06/19/24 2312 06/21/24 0441 06/22/24 0230 06/23/24 0421 06/24/24 0411  NA 138 137 139 138 140  K 3.6 2.7* 3.4* 3.9 3.4*  CL 93* 93* 98 100 103  CO2 20* 31 30 26 23   GLUCOSE 126* 91 128* 92 91  BUN 7 7 7  <5* 8  CREATININE 0.80 0.58* 0.52* 0.53* 0.83  CALCIUM 8.9 8.4* 8.5* 9.1 7.7*  MG  --  0.9* 1.7 1.3* 1.6*  PHOS  --  3.4 4.1 3.5   --    GFR: Estimated Creatinine Clearance: 110.9 mL/min (by C-G formula based on SCr of 0.83 mg/dL). Recent Labs  Lab 06/21/24 0441 06/22/24 0230 06/23/24 0421 06/24/24 0411  WBC 8.6 8.2 11.1* 12.2*    Liver Function Tests: Recent Labs  Lab 06/19/24 2312 06/21/24 0441 06/22/24 0230 06/23/24 0421 06/24/24 0411  AST 210* 110* 118* 88* 50*  ALT 70* 45* 48* 45* 28  ALKPHOS 203* 152* 148* 154* 140*  BILITOT 1.4* 1.4* 1.0 1.1 0.6  PROT 7.8 6.7 6.7 7.2 5.9*  ALBUMIN 3.6 3.1* 3.3* 3.2* 2.7*   Recent Labs  Lab 06/19/24 2312  LIPASE 21   No results for input(s): AMMONIA in the last 168 hours.  ABG    Component Value Date/Time   PHART 7.4 06/24/2024 0248   PCO2ART 44 06/24/2024 0248   PO2ART 96 06/24/2024 0248   HCO3 27.3 06/24/2024 0248   O2SAT 98.6 06/24/2024 0248     Coagulation Profile: Recent Labs  Lab 06/21/24 0441  INR 1.0    Cardiac Enzymes: No results for input(s): CKTOTAL, CKMB, CKMBINDEX, TROPONINI in the last 168 hours.  HbA1C: No results found for: HGBA1C  CBG: Recent Labs  Lab 06/22/24 0039 06/24/24 0359  GLUCAP 116* 107*    Review of Systems:   Review of Systems  Unable to perform ROS: Intubated     Past Medical History:  He,  has a past medical history of Alopecia, Dyspnea, GERD (gastroesophageal reflux disease), Hypertension, and Renal disorder.   Surgical History:   Past Surgical History:  Procedure Laterality Date   ORIF ANKLE FRACTURE Right 07/30/2017   Procedure: OPEN REDUCTION INTERNAL FIXATION (ORIF) ANKLE FRACTURE;  Surgeon: Tobie Priest, MD;  Location: [facility] ORS;  Service: Orthopedics;  Laterality: Right;   SPLENECTOMY  2008   SPLENECTOMY     INJURED PLAYING SPORTS AND HAD INTERNAL BLEEDING   SYNDESMOSIS REPAIR Right 07/30/2017   Procedure: POSSIBLE SYNDESMOSIS REPAIR;  Surgeon: Tobie Priest, MD;  Location: [facility] ORS;  Service: Orthopedics;  Laterality: Right;   WISDOM TOOTH EXTRACTION       Social History:    reports that he has never smoked. He has never used smokeless tobacco. He reports current alcohol use of about 21.0 standard drinks of alcohol per week. He reports current drug use. Drug: Other-see comments.   Family History:  His family history is not on file.   Allergies Allergies[1]   Home Medications  Prior to Admission medications  Medication Sig Start Date End Date Taking? Authorizing Provider  lisinopril  (ZESTRIL ) 5 MG tablet Take 1 tablet (5 mg total) by mouth daily. 11/11/21 11/11/22  Menshew, Candida LULLA Kings, PA-C     Critical care time: 55 minutes     Myya Meenach, ACNP-BC Pulmonary Critical Care, [place] Phone: [phone]          [1]  Allergies Allergen Reactions   Penicillins    "

## 2024-06-24 NOTE — Progress Notes (Signed)
 Patient's phone and necklace placed in patient belonging bag on shelf

## 2024-06-24 NOTE — Plan of Care (Signed)

## 2024-06-24 NOTE — Plan of Care (Signed)

## 2024-06-24 NOTE — Progress Notes (Signed)
 Nutrition Follow-up  INTERVENTION:   -Monitor magnesium , potassium, and phosphorus daily for at least 3 days, MD to replete as needed, as pt is at risk for refeeding syndrome. -Continue high dose thiamine , folic acid  and MVI daily   -Continue Vital HP @ 40 ml/hr today via NGT -60 ml Prosource TF 20 daily -Providing 960 kcals, 104g protein and 802 ml H2O  Starting 1/25: -Will transition to Osmolite 1.5 @ 35 ml/hr, advance by 10 ml every 8 hours to goal rate of 65 ml/hr. -Continue 60 ml Prosource TF 20 daily -At goal this will provide 2420 kcals, 118g protein and 1188 ml H2O  Free water per CCM.  NUTRITION DIAGNOSIS:   Inadequate oral intake related to lethargy/confusion as evidenced by NPO status.  Ongoing.  GOAL:   Patient will meet greater than or equal to 90% of their needs  Progressing.  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  Patient was intubated this morning, inability to protect airway. Tube feeds have been consulted to start. Protocol of Vital HP @ 40 and Prosource x 1 has begun. Starting tomorrow will transition to recommendations provided yesterday by [facility] RD. Pt still at refeeding risk.  Patient is currently intubated on ventilator support MV: 7.6 L/min Temp (24hrs), Avg:98.2 F (36.8 C), Min:98 F (36.7 C), Max:98.6 F (37 C) MAP: 102  Propofol : 20.4 ml/hr -> provides ~538 fat kcals.  Admission weight: 150 lbs Current weight: 143 lbs  Now off Precedex .   Medications: IV folic acid , Miralax , KLOR-CON , Senokot, IV Thiamine , Lactated ringers , IV Mg sulfate   Labs reviewed:  CBGs: 107 Low  potassium Low Mg  Diet Order:   Diet Order             Diet NPO time specified  Diet effective midnight                   EDUCATION NEEDS:   No education needs have been identified at this time  Skin:  Skin Assessment: Reviewed RN Assessment  Last BM:  1/21- type 7  Height:   Ht Readings from Last 1 Encounters:  06/19/24 5' 10 (1.778 m)    Weight:   Wt Readings from Last 1 Encounters:  06/24/24 65 kg    Ideal Body Weight:  75.4 kg  BMI:  Body mass index is 20.56 kg/m.  Estimated Nutritional Needs:   Kcal:  2100-2400kcal/day  Protein:  105-120g/day  Fluid:  2.1-2.4L/day   Morna Lee, MS, RD, LDN Inpatient Clinical Dietitian Contact via Secure chat

## 2024-06-25 DIAGNOSIS — J690 Pneumonitis due to inhalation of food and vomit: Secondary | ICD-10-CM

## 2024-06-25 LAB — BLOOD CULTURE ID PANEL (REFLEXED) - BCID2

## 2024-06-25 LAB — URINALYSIS, ROUTINE W REFLEX MICROSCOPIC
Bilirubin Urine: NEGATIVE
Glucose, UA: NEGATIVE mg/dL
Hgb urine dipstick: NEGATIVE
Ketones, ur: 20 mg/dL — AB
Leukocytes,Ua: NEGATIVE
Nitrite: NEGATIVE
Protein, ur: NEGATIVE mg/dL
Specific Gravity, Urine: 1.023 (ref 1.005–1.030)
pH: 5 (ref 5.0–8.0)

## 2024-06-25 LAB — CBC
HCT: 40.8 % (ref 39.0–52.0)
Hemoglobin: 13.4 g/dL (ref 13.0–17.0)
MCH: 29.7 pg (ref 26.0–34.0)
MCHC: 32.8 g/dL (ref 30.0–36.0)
MCV: 90.5 fL (ref 80.0–100.0)
Platelets: 409 10*3/uL — ABNORMAL HIGH (ref 150–400)
RBC: 4.51 MIL/uL (ref 4.22–5.81)
RDW: 15.9 % — ABNORMAL HIGH (ref 11.5–15.5)
WBC: 18.1 10*3/uL — ABNORMAL HIGH (ref 4.0–10.5)
nRBC: 0 % (ref 0.0–0.2)

## 2024-06-25 LAB — COMPREHENSIVE METABOLIC PANEL WITH GFR
ALT: 25 U/L (ref 0–44)
AST: 54 U/L — ABNORMAL HIGH (ref 15–41)
Albumin: 2.9 g/dL — ABNORMAL LOW (ref 3.5–5.0)
Alkaline Phosphatase: 135 U/L — ABNORMAL HIGH (ref 38–126)
Anion gap: 13 (ref 5–15)
BUN: 8 mg/dL (ref 6–20)
CO2: 23 mmol/L (ref 22–32)
Calcium: 8.2 mg/dL — ABNORMAL LOW (ref 8.9–10.3)
Chloride: 101 mmol/L (ref 98–111)
Creatinine, Ser: 0.72 mg/dL (ref 0.61–1.24)
GFR, Estimated: >60 mL/min
Glucose, Bld: 101 mg/dL — ABNORMAL HIGH (ref 70–99)
Potassium: 4.2 mmol/L (ref 3.5–5.1)
Sodium: 137 mmol/L (ref 135–145)
Total Bilirubin: 0.3 mg/dL (ref 0.0–1.2)
Total Protein: 6.9 g/dL (ref 6.5–8.1)

## 2024-06-25 LAB — GLUCOSE, CAPILLARY
Glucose-Capillary: 107 mg/dL — ABNORMAL HIGH (ref 70–99)
Glucose-Capillary: 115 mg/dL — ABNORMAL HIGH (ref 70–99)
Glucose-Capillary: 122 mg/dL — ABNORMAL HIGH (ref 70–99)
Glucose-Capillary: 133 mg/dL — ABNORMAL HIGH (ref 70–99)
Glucose-Capillary: 140 mg/dL — ABNORMAL HIGH (ref 70–99)

## 2024-06-25 LAB — TRIGLYCERIDES: Triglycerides: 330 mg/dL — ABNORMAL HIGH

## 2024-06-25 LAB — PHOSPHORUS: Phosphorus: 2.8 mg/dL (ref 2.5–4.6)

## 2024-06-25 LAB — MAGNESIUM: Magnesium: 1.9 mg/dL (ref 1.7–2.4)

## 2024-06-25 MED ORDER — LABETALOL HCL 5 MG/ML IV SOLN
INTRAVENOUS | Status: AC
  Filled 2024-06-25: qty 4

## 2024-06-25 MED ORDER — SODIUM CHLORIDE 0.9 % IV SOLN: INTRAVENOUS | Status: DC | PRN

## 2024-06-25 MED ORDER — LABETALOL HCL 5 MG/ML IV SOLN
10.0000 mg | INTRAVENOUS | Status: DC | PRN
  Administered 2024-06-25 – 2024-06-27 (×6): 10 mg via INTRAVENOUS
  Filled 2024-06-25 (×5): qty 4

## 2024-06-25 MED ORDER — PIPERACILLIN-TAZOBACTAM 3.375 G IVPB
3.3750 g | Freq: Three times a day (TID) | INTRAVENOUS | Status: DC
  Administered 2024-06-25 – 2024-06-28 (×10): 3.375 g via INTRAVENOUS
  Filled 2024-06-25 (×10): qty 50

## 2024-06-25 NOTE — Progress Notes (Addendum)
 PHARMACY - PHYSICIAN COMMUNICATION CRITICAL VALUE ALERT - BLOOD CULTURE IDENTIFICATION (BCID)  Alejandro Collier is an 39 y.o. male who presented to [facility] on 06/19/2024 with a chief complaint of alcohol withdrawal and seizures  Assessment:  1 of 2(aerobic) staph epidermidis, no resistance detected  Name of physician (or Provider) Contacted: Almarie Nose  Current antibiotics: Zosyn   Changes to prescribed antibiotics recommended:  Will continue current antibiotics and await sensitivities  Results for orders placed or performed during the hospital encounter of 06/19/24  Blood Culture ID Panel (Reflexed) (Collected: 06/25/2024  3:27 AM)  Result Value Ref Range   Enterococcus faecalis NOT DETECTED NOT DETECTED   Enterococcus Faecium NOT DETECTED NOT DETECTED   Listeria monocytogenes NOT DETECTED NOT DETECTED   Staphylococcus species DETECTED (A) NOT DETECTED   Staphylococcus aureus (BCID) NOT DETECTED NOT DETECTED   Staphylococcus epidermidis DETECTED (A) NOT DETECTED   Staphylococcus lugdunensis NOT DETECTED NOT DETECTED   Streptococcus species NOT DETECTED NOT DETECTED   Streptococcus agalactiae NOT DETECTED NOT DETECTED   Streptococcus pneumoniae NOT DETECTED NOT DETECTED   Streptococcus pyogenes NOT DETECTED NOT DETECTED   A.calcoaceticus-baumannii NOT DETECTED NOT DETECTED   Bacteroides fragilis NOT DETECTED NOT DETECTED   Enterobacterales NOT DETECTED NOT DETECTED   Enterobacter cloacae complex NOT DETECTED NOT DETECTED   Escherichia coli NOT DETECTED NOT DETECTED   Klebsiella aerogenes NOT DETECTED NOT DETECTED   Klebsiella oxytoca NOT DETECTED NOT DETECTED   Klebsiella pneumoniae NOT DETECTED NOT DETECTED   Proteus species NOT DETECTED NOT DETECTED   Salmonella species NOT DETECTED NOT DETECTED   Serratia marcescens NOT DETECTED NOT DETECTED   Haemophilus influenzae NOT DETECTED NOT DETECTED   Neisseria meningitidis NOT DETECTED NOT DETECTED   Pseudomonas aeruginosa NOT  DETECTED NOT DETECTED   Stenotrophomonas maltophilia NOT DETECTED NOT DETECTED   Candida albicans NOT DETECTED NOT DETECTED   Candida auris NOT DETECTED NOT DETECTED   Candida glabrata NOT DETECTED NOT DETECTED   Candida krusei NOT DETECTED NOT DETECTED   Candida parapsilosis NOT DETECTED NOT DETECTED   Candida tropicalis NOT DETECTED NOT DETECTED   Cryptococcus neoformans/gattii NOT DETECTED NOT DETECTED   Methicillin resistance mecA/C NOT DETECTED NOT DETECTED    [name] A [name] 06/25/2024  9:50 PM

## 2024-06-25 NOTE — Plan of Care (Signed)

## 2024-06-25 NOTE — Progress Notes (Signed)
 PHARMACY CONSULT NOTE  Pharmacy Consult for Electrolyte Monitoring and Replacement   Recent Labs: Potassium (mmol/L)  Date Value  06/25/2024 4.2  02/20/2013 4.1   Magnesium  (mg/dL)  Date Value  98/74/7973 1.9   Calcium (mg/dL)  Date Value  98/74/7973 8.2 (L)   Calcium, Total (mg/dL)  Date Value  90/77/7985 8.6   Albumin (g/dL)  Date Value  98/74/7973 2.9 (L)   Phosphorus (mg/dL)  Date Value  98/74/7973 2.8   Sodium (mmol/L)  Date Value  06/25/2024 137  02/20/2013 137   Assessment: 39 y.o. male with medical history significant for Hypertension and alcohol use disorder being admitted with an alcohol withdrawal seizure, witnessed by his family. Pharmacy is asked to follow and replace electrolytes while in CCU  Goal of Therapy:  Electrolytes WNL  Plan:  --No electrolyte replacement indicated at this time --Re-check electrolytes in AM  Alejandro Collier 06/25/2024 6:37 AM

## 2024-06-25 NOTE — Progress Notes (Signed)
 1/24 2350- checked pt temp 101.5 oral  1/25 0000- gave PRN tylenol  650 mg per tube, removed covers, turn thermostat down, applied cool compress to head  0030- rechecked temp 102.5 oral; applied ice packs; notified provider Almarie Nose, NP  0100- rechecked temp 101.6 orally; NP Ouma at bedside- she will put in new orders after assessing lab results

## 2024-06-25 NOTE — Progress Notes (Addendum)
 "  Alejandro Collier, MRN:  [id number], DOB:  [birth year], LOS: 5 ADMISSION DATE:  06/19/2024, CHIEF COMPLAINT:  EtOH withdrawal   History of Present Illness:  Alejandro Collier is a 39 year old male with past medical history significant for hypertension, GERD, renal disorder, chronic dyspnea and alcohol use disorder admitted with alcohol withdrawal and seizure witnessed while he was at work. It was reported his last drink was on 1/19.  He does have a history of seizures with alcohol withdrawal over a year ago. He denied significant changes to his amount of alcohol intake prior to arrival.    ED Course: Upon arrival via EMS, it was reported he had one episode of vomiting en route. On presentation patient was tachycardic, tachypneic and hypotensive. He denied symptoms of nausea/vomiting, diarrhea or abdominal pain. Once arrived to the ED, he was acutely agitated requiring multiple doses of ativan  and started on IV fluids  Pertinent  Medical History  Alcohol Use Disorder Hypertension Renal Disorder GERD  Significant Hospital Events: Including procedures, antibiotic start and stop dates in addition to other pertinent events   1/19: Admitted following a witnessed seizure at home in the setting of alcohol withdrawal. Started on phenobarbital  taper with ativan  prn 1/20: Remained agitated with phenobarbital  and prn ativan  1/21: rapid response due to patient being agitated requiring high doses of Ativan  for alcohol withdraw was then started on phenobarbital  taper and on a Precedex  drip transferred to stepdown  1/22: Remained on precedex , phenobarb and prn ativan  with agitation and combativeness. 1/23: Still on precedex  gtt with acute agitation with spells of violence. PCCM consulted d/t precedex . Intubated for inability to protect airway after receiving phenobarbital  for increasing agitation overnight. 1/24: intubated overnight, sedated during the day with propofol   Interim History / Subjective:   Remains sedated on propofol . Increased oral secretions, some secretions though ETT that are thick. WUA attempted.  Objective    Blood pressure (!) 148/94, pulse (!) 124, temperature 98.5 F (36.9 C), temperature source Axillary, resp. rate 14, height 5' 10 (1.778 m), weight 67.3 kg, SpO2 100%.    Vent Mode: PRVC FiO2 (%):  [30 %] 30 % Set Rate:  [16 bmp] 16 bmp Vt Set:  [450 mL] 450 mL PEEP:  [5 cmH20] 5 cmH20 Plateau Pressure:  [14 cmH20-16 cmH20] 16 cmH20   Intake/Output Summary (Last 24 hours) at 06/25/2024 0827 Last data filed at 06/25/2024 0800 Gross per 24 hour  Intake 3684.37 ml  Output 760 ml  Net 2924.37 ml   Filed Weights   06/19/24 2223 06/24/24 0500 06/25/24 0453  Weight: 68 kg 65 kg 67.3 kg    Examination: Physical Exam Constitutional:      General: He is not in acute distress.    Appearance: He is ill-appearing.  Cardiovascular:     Rate and Rhythm: Normal rate and regular rhythm.     Pulses: Normal pulses.     Heart sounds: Normal heart sounds.  Pulmonary:     Breath sounds: No wheezing.     Comments: Ventilated breath sounds bilaterally Neurological:     Mental Status: He is disoriented.     Motor: Weakness present.      Assessment and Plan   39 year old male with history of severe alcohol use disorder and prior alcohol withdrawal seizure who presents to the hospital with altered mental status and seizure in the setting of alcohol withdrawal. Admitted to the ICU and currently treated for DT's. He was treated with phenobarbital  in  addition to dexmedetomidine , with worsening mental status and ultimate intubation and initiation on mechanical ventilation.  #Acute Hypoxic Respiratory Failure #Aspiration Pneumonia #Toxic Metabolic Encephalopathy #Delirium Tremens  #Severe Alcohol Withdrawal #Alcohol Withdrawal Seizure #Alcoholic Ketosis     Neuro - With his history of severe withdrawals, decided to treat his alcohol withdrawal exclusively with  phenobarbital . Did receive a 10 mg/kg bolus of phenobarb and more doses overnight. Appears that he was agitated two nights ago, receiving more phenobarbital  and increased dose of dexmedetomidine  and resulting in worsening mental status. Will attempt to work on weaning off the ventilator tomorrow with a wake up assessment and SBT.  -propofol  for sedation, fentanyl  for analgesia  -daily WUA  CV - off vasopressors, no active issue, goal MAP > 65 Pulm - minimal vent settings, though concern for increased respiratory secretions suggestive of aspiration pneumonia. Respiratory cultures sent and he is on broad spectrum antibiotics.  -daily SBT GI - start tube feeds, PPI for SUP Renal - kidney function at baseline, replete electrolytes. LR at 100 ml/hour Endo - ICU glycemic Hem/Onc - DVT prophylaxis with lovenox  ID - broad spectrum antibiotics for management of possible aspiration pneumonia, respiratory cultures sent.  -zosyn  day one: 06/25/2024  -follow respiratory and blood cultures  Labs   CBC: Recent Labs  Lab 06/19/24 2235 06/21/24 0441 06/22/24 0230 06/23/24 0421 06/24/24 0411 06/25/24 0334  WBC 14.2* 8.6 8.2 11.1* 12.2* 18.1*  NEUTROABS 11.3*  --   --   --   --   --   HGB 13.8 12.8* 12.3* 14.2 11.5* 13.4  HCT 41.9 37.3* 37.4* 42.4 35.4* 40.8  MCV 88.6 85.4 87.6 88.3 90.1 90.5  PLT 129* 104* 129* 189 284 409*    Basic Metabolic Panel: Recent Labs  Lab 06/21/24 0441 06/22/24 0230 06/23/24 0421 06/24/24 0411 06/25/24 0334  NA 137 139 138 140 137  K 2.7* 3.4* 3.9 3.4* 4.2  CL 93* 98 100 103 101  CO2 31 30 26 23 23   GLUCOSE 91 128* 92 91 101*  BUN 7 7 <5* 8 8  CREATININE 0.58* 0.52* 0.53* 0.83 0.72  CALCIUM 8.4* 8.5* 9.1 7.7* 8.2*  MG 0.9* 1.7 1.3* 1.6* 1.9  PHOS 3.4 4.1 3.5  --  2.8   GFR: Estimated Creatinine Clearance: 119.2 mL/min (by C-G formula based on SCr of 0.72 mg/dL). Recent Labs  Lab 06/22/24 0230 06/23/24 0421 06/24/24 0411 06/25/24 0334  WBC 8.2 11.1*  12.2* 18.1*    Liver Function Tests: Recent Labs  Lab 06/21/24 0441 06/22/24 0230 06/23/24 0421 06/24/24 0411 06/25/24 0334  AST 110* 118* 88* 50* 54*  ALT 45* 48* 45* 28 25  ALKPHOS 152* 148* 154* 140* 135*  BILITOT 1.4* 1.0 1.1 0.6 0.3  PROT 6.7 6.7 7.2 5.9* 6.9  ALBUMIN 3.1* 3.3* 3.2* 2.7* 2.9*   Recent Labs  Lab 06/19/24 2312  LIPASE 21   No results for input(s): AMMONIA in the last 168 hours.  ABG    Component Value Date/Time   PHART 7.4 06/24/2024 0248   PCO2ART 44 06/24/2024 0248   PO2ART 96 06/24/2024 0248   HCO3 27.3 06/24/2024 0248   O2SAT 98.6 06/24/2024 0248     Coagulation Profile: Recent Labs  Lab 06/21/24 0441  INR 1.0    Cardiac Enzymes: No results for input(s): CKTOTAL, CKMB, CKMBINDEX, TROPONINI in the last 168 hours.  HbA1C: No results found for: HGBA1C  CBG: Recent Labs  Lab 06/22/24 0039 06/24/24 0359 06/24/24 1951 06/25/24 0016 06/25/24 0406  GLUCAP 116* 107* 106* 107* 115*    Review of Systems:   N/A  Past Medical History:  He,  has a past medical history of Alopecia, Dyspnea, GERD (gastroesophageal reflux disease), Hypertension, and Renal disorder.   Surgical History:   Past Surgical History:  Procedure Laterality Date   ORIF ANKLE FRACTURE Right 07/30/2017   Procedure: OPEN REDUCTION INTERNAL FIXATION (ORIF) ANKLE FRACTURE;  Surgeon: Tobie Priest, MD;  Location: [facility] ORS;  Service: Orthopedics;  Laterality: Right;   SPLENECTOMY  2008   SPLENECTOMY     INJURED PLAYING SPORTS AND HAD INTERNAL BLEEDING   SYNDESMOSIS REPAIR Right 07/30/2017   Procedure: POSSIBLE SYNDESMOSIS REPAIR;  Surgeon: Tobie Priest, MD;  Location: [facility] ORS;  Service: Orthopedics;  Laterality: Right;   WISDOM TOOTH EXTRACTION       Social History:   reports that he has never smoked. He has never used smokeless tobacco. He reports current alcohol use of about 21.0 standard drinks of alcohol per week. He reports current drug use. Drug:  Other-see comments.   Family History:  His family history is not on file.   Allergies Allergies[1]   Home Medications  Prior to Admission medications  Medication Sig Start Date End Date Taking? Authorizing Provider  lisinopril  (ZESTRIL ) 5 MG tablet Take 1 tablet (5 mg total) by mouth daily. 11/11/21 11/11/22  Menshew, Candida LULLA Kings, PA-C     The patient is critically ill due to alcohol withdrawal seizure, acute hypoxic respiratory failure, aspiration pneumonia.  Critical care was necessary to treat or prevent imminent or life-threatening deterioration. I personally performed high risk medication and infusion titration and management, titration, monitoring, and management of vasopressor/ionotrope infusion, and mechanical ventilation management and titration. Critical care time was spent by me on the following activities: development of a treatment plan with the patient and/or surrogate as well as nursing, discussions with consultants, evaluation of the patient's response to treatment, examination of the patient, obtaining a history from the patient or surrogate, ordering and performing treatments and interventions, ordering and review of laboratory studies, ordering and review of radiographic studies, review of telemetry data including pulse oximetry, re-evaluation of patient's condition and participation in multidisciplinary rounds.   I personally spent 34 minutes providing critical care not including any separately billable procedures.   Belva November, MD [facility] 06/25/2024 4:10 PM         [1]  Allergies Allergen Reactions   Penicillins    "

## 2024-06-25 NOTE — Plan of Care (Signed)

## 2024-06-26 ENCOUNTER — Inpatient Hospital Stay: Payer: Self-pay

## 2024-06-26 DIAGNOSIS — R569 Unspecified convulsions: Principal | ICD-10-CM

## 2024-06-26 DIAGNOSIS — J690 Pneumonitis due to inhalation of food and vomit: Secondary | ICD-10-CM

## 2024-06-26 DIAGNOSIS — R7989 Other specified abnormal findings of blood chemistry: Secondary | ICD-10-CM

## 2024-06-26 DIAGNOSIS — J1520 Pneumonia due to staphylococcus, unspecified: Secondary | ICD-10-CM

## 2024-06-26 LAB — GLUCOSE, CAPILLARY
Glucose-Capillary: 110 mg/dL — ABNORMAL HIGH (ref 70–99)
Glucose-Capillary: 122 mg/dL — ABNORMAL HIGH (ref 70–99)
Glucose-Capillary: 114 mg/dL — ABNORMAL HIGH (ref 70–99)
Glucose-Capillary: 123 mg/dL — ABNORMAL HIGH (ref 70–99)
Glucose-Capillary: 111 mg/dL — ABNORMAL HIGH (ref 70–99)
Glucose-Capillary: 113 mg/dL — ABNORMAL HIGH (ref 70–99)

## 2024-06-26 LAB — COMPREHENSIVE METABOLIC PANEL WITH GFR
ALT: 22 U/L (ref 0–44)
AST: 54 U/L — ABNORMAL HIGH (ref 15–41)
Albumin: 2.5 g/dL — ABNORMAL LOW (ref 3.5–5.0)
Alkaline Phosphatase: 111 U/L (ref 38–126)
Anion gap: 12 (ref 5–15)
BUN: 9 mg/dL (ref 6–20)
CO2: 26 mmol/L (ref 22–32)
Calcium: 7.6 mg/dL — ABNORMAL LOW (ref 8.9–10.3)
Chloride: 101 mmol/L (ref 98–111)
Creatinine, Ser: 0.62 mg/dL (ref 0.61–1.24)
GFR, Estimated: >60 mL/min
Glucose, Bld: 116 mg/dL — ABNORMAL HIGH (ref 70–99)
Potassium: 3.7 mmol/L (ref 3.5–5.1)
Sodium: 138 mmol/L (ref 135–145)
Total Bilirubin: <0.2 mg/dL (ref 0.0–1.2)
Total Protein: 5.9 g/dL — ABNORMAL LOW (ref 6.5–8.1)

## 2024-06-26 LAB — CBC
HCT: 33.7 % — ABNORMAL LOW (ref 39.0–52.0)
Hemoglobin: 11.2 g/dL — ABNORMAL LOW (ref 13.0–17.0)
MCH: 29.6 pg (ref 26.0–34.0)
MCHC: 33.2 g/dL (ref 30.0–36.0)
MCV: 89.2 fL (ref 80.0–100.0)
Platelets: 519 10*3/uL — ABNORMAL HIGH (ref 150–400)
RBC: 3.78 MIL/uL — ABNORMAL LOW (ref 4.22–5.81)
RDW: 16.2 % — ABNORMAL HIGH (ref 11.5–15.5)
WBC: 18.8 10*3/uL — ABNORMAL HIGH (ref 4.0–10.5)
nRBC: 0 % (ref 0.0–0.2)

## 2024-06-26 LAB — PHOSPHORUS: Phosphorus: 4 mg/dL (ref 2.5–4.6)

## 2024-06-26 LAB — POTASSIUM: Potassium: 3.4 mmol/L — ABNORMAL LOW (ref 3.5–5.1)

## 2024-06-26 LAB — MAGNESIUM
Magnesium: 1.5 mg/dL — ABNORMAL LOW (ref 1.7–2.4)
Magnesium: 2 mg/dL (ref 1.7–2.4)

## 2024-06-26 MED ORDER — ONDANSETRON HCL 4 MG/2ML IJ SOLN: 4.0000 mg | Freq: Four times a day (QID) | INTRAMUSCULAR | Status: DC | PRN

## 2024-06-26 MED ORDER — SODIUM CHLORIDE 0.9 % IV SOLN: INTRAVENOUS | Status: AC | PRN

## 2024-06-26 MED ORDER — FOLIC ACID 1 MG PO TABS
1.0000 mg | ORAL_TABLET | Freq: Every day | ORAL | Status: DC
  Administered 2024-06-28 – 2024-06-29 (×2): 1 mg via ORAL
  Filled 2024-06-26 (×2): qty 1

## 2024-06-26 MED ORDER — ACETAMINOPHEN 650 MG RE SUPP: 650.0000 mg | Freq: Four times a day (QID) | RECTAL | Status: DC | PRN

## 2024-06-26 MED ORDER — ADULT MULTIVITAMIN W/MINERALS CH
1.0000 | ORAL_TABLET | Freq: Every day | ORAL | Status: DC
  Administered 2024-06-27 – 2024-07-03 (×7): 1 via ORAL
  Filled 2024-06-26 (×7): qty 1

## 2024-06-26 MED ORDER — HALOPERIDOL LACTATE 5 MG/ML IJ SOLN: 5.0000 mg | INTRAMUSCULAR | Status: AC

## 2024-06-26 MED ORDER — ONDANSETRON HCL 4 MG PO TABS: 4.0000 mg | ORAL_TABLET | Freq: Four times a day (QID) | ORAL | Status: DC | PRN

## 2024-06-26 MED ORDER — DEXMEDETOMIDINE HCL IN NACL 400 MCG/100ML IV SOLN
0.0000 ug/kg/h | INTRAVENOUS | Status: DC
  Administered 2024-06-26: 1.2 ug/kg/h via INTRAVENOUS
  Administered 2024-06-26: 1 ug/kg/h via INTRAVENOUS
  Administered 2024-06-27: 1.2 ug/kg/h via INTRAVENOUS
  Administered 2024-06-27: 1.1 ug/kg/h via INTRAVENOUS
  Administered 2024-06-27: 1.2 ug/kg/h via INTRAVENOUS
  Filled 2024-06-26 (×5): qty 100

## 2024-06-26 MED ORDER — HALOPERIDOL LACTATE 5 MG/ML IJ SOLN
5.0000 mg | Freq: Four times a day (QID) | INTRAMUSCULAR | Status: DC | PRN
  Administered 2024-06-26 – 2024-06-27 (×2): 5 mg via INTRAVENOUS
  Filled 2024-06-26 (×2): qty 1

## 2024-06-26 MED ORDER — FOLIC ACID 1 MG PO TABS: 1.0000 mg | ORAL_TABLET | Freq: Every day | ORAL | Status: DC

## 2024-06-26 MED ORDER — MAGNESIUM SULFATE 2 GM/50ML IV SOLN
2.0000 g | Freq: Once | INTRAVENOUS | Status: AC
  Administered 2024-06-26: 2 g via INTRAVENOUS
  Filled 2024-06-26: qty 50

## 2024-06-26 MED ORDER — HALOPERIDOL LACTATE 5 MG/ML IJ SOLN
INTRAMUSCULAR | Status: AC
  Administered 2024-06-26: 5 mg via INTRAVENOUS
  Filled 2024-06-26: qty 2

## 2024-06-26 MED ORDER — VANCOMYCIN HCL 750 MG/150ML IV SOLN
750.0000 mg | Freq: Three times a day (TID) | INTRAVENOUS | Status: DC
  Administered 2024-06-26 – 2024-06-28 (×5): 750 mg via INTRAVENOUS
  Filled 2024-06-26 (×7): qty 150

## 2024-06-26 MED ORDER — VANCOMYCIN HCL 1250 MG/250ML IV SOLN
1250.0000 mg | Freq: Once | INTRAVENOUS | Status: AC
  Administered 2024-06-26: 1250 mg via INTRAVENOUS
  Filled 2024-06-26: qty 250

## 2024-06-26 MED ORDER — ACETAMINOPHEN 325 MG PO TABS
650.0000 mg | ORAL_TABLET | Freq: Four times a day (QID) | ORAL | Status: DC | PRN
  Administered 2024-06-27 – 2024-06-28 (×2): 650 mg via ORAL
  Filled 2024-06-26 (×2): qty 2

## 2024-06-26 MED ORDER — POTASSIUM CHLORIDE CRYS ER 20 MEQ PO TBCR: 40.0000 meq | EXTENDED_RELEASE_TABLET | Freq: Once | ORAL | Status: DC

## 2024-06-26 MED ORDER — SODIUM CHLORIDE 0.9 % IV SOLN
1.0000 mg | Freq: Every day | INTRAVENOUS | Status: DC
  Administered 2024-06-27: 1 mg via INTRAVENOUS
  Filled 2024-06-26 (×3): qty 0.2

## 2024-06-26 MED ORDER — POTASSIUM CHLORIDE 20 MEQ PO PACK
40.0000 meq | PACK | Freq: Once | ORAL | Status: AC
  Administered 2024-06-26: 40 meq
  Filled 2024-06-26: qty 2

## 2024-06-26 MED ORDER — ORAL CARE MOUTH RINSE: 15.0000 mL | OROMUCOSAL | Status: DC | PRN

## 2024-06-26 NOTE — Progress Notes (Signed)
"                                                            Brief Progress Note   Pt self extubated at 1645 respiratory therapist and RN at bedside pt placed on 4L O2 via nasal canula.  Arrived at bedside pt with strong cough and able to cough up secretions.  Transitioned to [facility] @40L /40% with O2 sats 95% and no signs of respiratory distress.  Pt awake and oriented to self and place only with intermittent agitation and attempting to get out of bed.  Bedside sitter ordered for pt safety.  Called back to pts bedside due to violent/aggressive/threatening behavior towards staff with inability to redirect.  Started pt on precedex  gtt, however he remained violent with inability to redirect.  Orders given to administer 5 mg of iv haldol  x1 dose, security along with 2 RN's at bedside due to pt safety concerns to staff and himself.  Pt placed in soft bilateral wrist restraints.  Pt calmed down following interventions and is currently resting.  Notified pts mother Nannette Dorsett via telephone regarding events as outlined above.  Will continue to monitor and assess pt   Lonell Moose, [facility]  Pulmonary/Critical Care Pager [phone] (please enter 7 digits) PCCM Consult Pager [phone] (please enter 7 digits) a "

## 2024-06-26 NOTE — TOC Progression Note (Signed)
 Transition of Care [facility]) - Progression Note    Patient Details  Name: Alejandro Collier MRN: [id number] Date of Birth: Mar 02, 1986  Transition of Care [facility]) CM/SW Contact  [name], LCSW Phone Number: 06/26/2024, 9:36 AM  Clinical Narrative:    Chart reviewed. Patient is intubated.  There are no ICM needs at this time.                      Expected Discharge Plan and Services                                               Social Drivers of Health (SDOH) Interventions SDOH Screenings   Food Insecurity: No Food Insecurity (06/20/2024)  Housing: Low Risk (06/20/2024)  Transportation Needs: No Transportation Needs (06/20/2024)  Utilities: Not At Risk (06/20/2024)  Tobacco Use: Low Risk (06/19/2024)    Readmission Risk Interventions     No data to display

## 2024-06-26 NOTE — Plan of Care (Incomplete)
 This patient remains on [facility]ICU as of time of writing. The patient responds to tactile stimuli but is unable to follow commands. Overnight, the patient exhibited violent behaviors towards staff, including  swinging his fists and feet towards staff members. The patient remains in bilateral soft wrist restraints overnight. The patient is receiving active infusions of Precedex  and LR via PIV. The patient continues to have in place PIVs and a Foley catheter. The patient remains on HHFNC O2 at 40 L / min at 40% FiO2. CIWA assessments continue q 6 hours.   Problem: Education: Goal: Knowledge of General Education information will improve Description: Including pain rating scale, medication(s)/side effects and non-pharmacologic comfort measures Outcome: Progressing   Problem: Health Behavior/Discharge Planning: Goal: Ability to manage health-related needs will improve Outcome: Progressing   Problem: Clinical Measurements: Goal: Ability to maintain clinical measurements within normal limits will improve Outcome: Progressing Goal: Will remain free from infection Outcome: Progressing Goal: Diagnostic test results will improve Outcome: Progressing Goal: Respiratory complications will improve Outcome: Progressing Goal: Cardiovascular complication will be avoided Outcome: Progressing   Problem: Activity: Goal: Risk for activity intolerance will decrease Outcome: Progressing   Problem: Nutrition: Goal: Adequate nutrition will be maintained Outcome: Progressing   Problem: Coping: Goal: Level of anxiety will decrease Outcome: Progressing   Problem: Elimination: Goal: Will not experience complications related to bowel motility Outcome: Progressing Goal: Will not experience complications related to urinary retention Outcome: Progressing   Problem: Pain Managment: Goal: General experience of comfort will improve and/or be controlled Outcome: Progressing   Problem: Safety: Goal: Ability to  remain free from injury will improve Outcome: Progressing   Problem: Skin Integrity: Goal: Risk for impaired skin integrity will decrease Outcome: Progressing   Problem: Safety: Goal: Violent Restraint(s) Outcome: Progressing   Problem: Safety: Goal: Non-violent Restraint(s) Outcome: Progressing   Problem: Education: Goal: Expressions of having a comfortable level of knowledge regarding the disease process will increase Outcome: Progressing   Problem: Coping: Goal: Ability to adjust to condition or change in health will improve Outcome: Progressing Goal: Ability to identify appropriate support needs will improve Outcome: Progressing   Problem: Health Behavior/Discharge Planning: Goal: Compliance with prescribed medication regimen will improve Outcome: Progressing   Problem: Medication: Goal: Risk for medication side effects will decrease Outcome: Progressing   Problem: Clinical Measurements: Goal: Complications related to the disease process, condition or treatment will be avoided or minimized Outcome: Progressing Goal: Diagnostic test results will improve Outcome: Progressing   Problem: Safety: Goal: Verbalization of understanding the information provided will improve Outcome: Progressing   Problem: Self-Concept: Goal: Level of anxiety will decrease Outcome: Progressing Goal: Ability to verbalize feelings about condition will improve Outcome: Progressing

## 2024-06-26 NOTE — Plan of Care (Signed)

## 2024-06-26 NOTE — Progress Notes (Signed)
 Pharmacy Antibiotic Note  Alejandro Collier is a 39 y.o. male w/ PMH of HTN, EtOH abuse, GERD admitted on 06/19/2024 admitted for seizure secondary to alcohol withdrawal.  Pharmacy has been consulted for vancomycin  dosing.  Plan: start vancomycin  1250 mg IV x 1 then 750 mg IV every 8 hours Goal AUC 400-550. Expected AUC: 414.5 SCr used: 0.80 mg/dL (rounded up)   Height: 5' 10 (177.8 cm) Weight: 67.5 kg (148 lb 13 oz) IBW/kg (Calculated) : 73  Temp (24hrs), Avg:99.3 F (37.4 C), Min:97.5 F (36.4 C), Max:101.3 F (38.5 C)  Recent Labs  Lab 06/22/24 0230 06/23/24 0421 06/24/24 0411 06/25/24 0334 06/26/24 0358  WBC 8.2 11.1* 12.2* 18.1* 18.8*  CREATININE 0.52* 0.53* 0.83 0.72 0.62    Estimated Creatinine Clearance: 119.5 mL/min (by C-G formula based on SCr of 0.62 mg/dL).    Allergies[1]  Antimicrobials this admission: 01/25 Zosyn  >>  01/26 vancomycin  >>   Microbiology results: 01/25 BCx: S epidermidis 01/25 Sputum: mod S aureus   01/22 MRSA PCR: not detected  Thank you for allowing pharmacy to be a part of this patients care.  Adriana JONETTA Bolster 06/26/2024 10:39 AM     [1]  Allergies Allergen Reactions   Penicillins

## 2024-06-26 NOTE — Progress Notes (Signed)
 Updated pts mother Merrill Brewer via telephone regarding pts condition and current plan of care all questions answered.  Will continue to monitor and assess pt.   Lonell Moose, AGNP  Pulmonary/Critical Care Pager [phone] (please enter 7 digits) PCCM Consult Pager [phone] (please enter 7 digits)

## 2024-06-26 NOTE — Progress Notes (Signed)
 Rt called to room due to patient self-extubating. Placed on 4L [place]. Once Dr. Dgayli in to see patient, he asked for patient to be placed on HHFNC. Placed on HHFNC 40L and 40%. Saturations 95%.

## 2024-06-26 NOTE — Progress Notes (Signed)
 Informed pts mother Merrill Brewer via telephone pt self extubated, he is currently awake and from a respiratory standpoint he is stable right now on HHFNC.  He remains HIGH RISK for REINTUBATION due to secretion burden.  Will continue to monitor and assess pt   Lonell Moose, [facility]  Pulmonary/Critical Care Pager [phone] (please enter 7 digits) PCCM Consult Pager [phone] (please enter 7 digits) a

## 2024-06-26 NOTE — Progress Notes (Signed)
 PHARMACY CONSULT NOTE  Pharmacy Consult for Electrolyte Monitoring and Replacement   Recent Labs: Potassium (mmol/L)  Date Value  06/26/2024 3.4 (L)  02/20/2013 4.1   Magnesium  (mg/dL)  Date Value  98/73/7973 2.0   Calcium (mg/dL)  Date Value  98/73/7973 7.6 (L)   Calcium, Total (mg/dL)  Date Value  90/77/7985 8.6   Albumin (g/dL)  Date Value  98/73/7973 2.5 (L)   Phosphorus (mg/dL)  Date Value  98/73/7973 4.0   Sodium (mmol/L)  Date Value  06/26/2024 138  02/20/2013 137   Assessment: 39 y.o. male with medical history significant for Hypertension and alcohol use disorder being admitted with an alcohol withdrawal seizure, witnessed by his family. Pharmacy is asked to follow and replace electrolytes while in CCU  Goal of Therapy:  Electrolytes WNL  Plan:  --40 mEq KCl per tube x 1 --Re-check electrolytes in AM  Alejandro Collier 06/26/2024 2:28 PM

## 2024-06-26 NOTE — Progress Notes (Signed)
 PHARMACY CONSULT NOTE  Pharmacy Consult for Electrolyte Monitoring and Replacement   Recent Labs: Potassium (mmol/L)  Date Value  06/26/2024 3.7  02/20/2013 4.1   Magnesium  (mg/dL)  Date Value  98/73/7973 1.5 (L)   Calcium (mg/dL)  Date Value  98/73/7973 7.6 (L)   Calcium, Total (mg/dL)  Date Value  90/77/7985 8.6   Albumin (g/dL)  Date Value  98/73/7973 2.5 (L)   Phosphorus (mg/dL)  Date Value  98/73/7973 4.0   Sodium (mmol/L)  Date Value  06/26/2024 138  02/20/2013 137   Assessment: 39 y.o. male with medical history significant for Hypertension and alcohol use disorder being admitted with an alcohol withdrawal seizure, witnessed by his family. Pharmacy is asked to follow and replace electrolytes while in CCU  Goal of Therapy:  Electrolytes WNL  Plan:  --2 grams IV magnesium  sulfate x 1 --Re-check electrolytes at 1400 and again in AM  Adriana JONETTA Bolster 06/26/2024 7:17 AM

## 2024-06-26 NOTE — Progress Notes (Signed)
 "  NAME:  Alejandro Collier, MRN:  [id number], DOB:  1985/12/10, LOS: 6 ADMISSION DATE:  06/19/2024, CONSULTATION DATE:  06/23/24 REFERRING MD:  Dr. Jerelene, CHIEF COMPLAINT:  Seizures    Brief Synopsis:  39 year old male with a past medical history significant for hypertension, renal disorder (IGA/MCD Syndrome), GERD, chronic dyspnea and alcohol abuse, admitted for seizure secondary to alcohol withdrawal.  History of Present Illness:  Remer Couse is a 39 year old male with past medical history significant for hypertension, GERD, renal disorder, chronic dyspnea and alcohol use disorder admitted with alcohol withdrawal and seizure witnessed while he was at work. It was reported his last drink was on 1/19.  He does have a history of seizures with alcohol withdrawal over a year ago. He denied significant changes to his amount of alcohol intake prior to arrival.   ED Course: Upon arrival via EMS, it was reported he had one episode of vomiting en route. On presentation patient was tachycardic, tachypneic and hypotensive. He denied symptoms of nausea/vomiting, diarrhea or abdominal pain. Once arrived to the ED, he was acutely agitated requiring multiple doses of ativan  and started on IV fluids  Vitals on Arrival: Temp: 97.6 BP: 155/101  HR: 104 RR: 28 SpO2: 98% on room air  Pertinent Labs/Diagnostics: Notable for leukocytosis, EtOH <15, transaminitis, hyperbilirubinemia and metabolic acidosis. No electrolyte derangements  EKG Interpretation: SR, atypical RBBB, LVH  Chemistry:  Na+:138 K+: 3.6 BUN/Cr.: 7/0.80 Serum CO2/ AG: 20/26 Glucose: 126 AST/ALT: 210/70  Hematology:  WBC: 14.2 Hgb: 13.8 Platelets 129 INR: 1.0  ID: MRSA PCR: negative  Medications Administered:  IVF: 1L NaCl Thiamine  Zofran  4mg  Lorazepam    Hospital Course: See below listed under significant hospital events  Pertinent Medical History  Alcohol Use Disorder Hypertension Renal Disorder GERD   Significant  Hospital Events: Including procedures, antibiotic start and stop dates in addition to other pertinent events   1/19: Admitted following a witnessed seizure at home in the setting of alcohol withdrawal. Started on phenobarbital  taper with ativan  prn 1/20: Remained agitated with phenobarbital  and prn ativan  1/21: rapid response due to patient being agitated requiring high doses of Ativan  for alcohol withdraw was then started on phenobarbital  taper and on a Precedex  drip transferred to stepdown  1/22: Remained on precedex , phenobarb and prn ativan  with agitation and combativeness. 1/23: Still on precedex  gtt with acute agitation with spells of violence. PCCM consulted d/t precedex . Intubated for inability to protect airway after receiving phenobarbital  for increasing agitation overnight. 1/26: Pt remains mechanically intubated followed commands during WUA, however secretion burden precludes extubation   Interim History / Subjective:  As outlined above under significant events   Objective    Blood pressure (!) 141/103, pulse (!) 108, temperature 99.4 F (37.4 C), temperature source Axillary, resp. rate 20, height 5' 10 (1.778 m), weight 67.5 kg, SpO2 100%.    Vent Mode: PRVC FiO2 (%):  [24 %-30 %] 24 % Set Rate:  [16 bmp] 16 bmp Vt Set:  [450 mL] 450 mL PEEP:  [5 cmH20] 5 cmH20 Plateau Pressure:  [12 cmH20] 12 cmH20   Intake/Output Summary (Last 24 hours) at 06/26/2024 1253 Last data filed at 06/26/2024 1244 Gross per 24 hour  Intake 4871.59 ml  Output 800 ml  Net 4071.59 ml   Filed Weights   06/24/24 0500 06/25/24 0453 06/26/24 0439  Weight: 65 kg 67.3 kg 67.5 kg    Examination: General: Acutely-ill appearing male, NAD mechanically intubated  HENT: Atraumatic, normocephalic, neck supple,  no JVD CV: Sinus tachycardiac, no m/r/g, 2+ radial/2+ distal pulses, no edema  Pulm: Rhonchi throughout, even, non labored  GI: +BS x4, soft, non distended  Extremities: Normal bulk and tone,  moves all extremities  Neuro: Sedated, follows commands, PERRL  GU: Indwelling foley draining yellow urine    Resolved problem list   Assessment and Plan   #Seizure due to ETOH withdrawal  #Acute toxic metabolic encephalopathy~improving   #Sedation needs secondary to mechanical ventilation pain/discomfort  - Correct metabolic derangements  - Maintain RASS goal 0 to -1 - PAD protocol to maintain RASS goal: propofol  gtt and prn fentanyl   - Continue thiamine , folic acid , and mvi  - Seizure precautions - WUA daily   #Hypertension - Continuous telemetry monitoring - Prn hydralazine  and labetalol  for bp management (goal sbp >165 and/or dbp >100)   #Alcoholic Liver Injury/Disease #Transaminitis~improving RUQ US  01/22: cholelithiasis w/o evidence of cholecystitis; probably steatosis - Trend hepatic function and monitor coags  - Constipation protocol  - Continue TF's   #Staph epi bacteremia  #Staph aureus pneumonia  - Trend WBC and monitor fever curve - Follow cultures  - Continue zosyn , add vancomycin  01/26   #AGMA iso Alcoholic Ketoacidosis~resolved #Hypokalemia~improving   #Hypomagnesia  - Trend BMP - Strict I&O's  - Avoid nephrotoxins as able - Ensure adequate renal perfusion - Replace electrolytes as indicated  #Anemia no signs of active bleeding  #VTE Prophylaxis - Trend CBC - Transfuse if Hgb <7 or platelets <20,000 or active bleeding - Monitor for s/sx of bleeding - DVT prophylaxis: lovenox , SCDs  #Endo - CBG's q4hrs  - Target CBG readings 140 to 180 - Follow hypo/hyperglycemic protocol   Labs   CBC: Recent Labs  Lab 06/19/24 2235 06/21/24 0441 06/22/24 0230 06/23/24 0421 06/24/24 0411 06/25/24 0334 06/26/24 0358  WBC 14.2*   < > 8.2 11.1* 12.2* 18.1* 18.8*  NEUTROABS 11.3*  --   --   --   --   --   --   HGB 13.8   < > 12.3* 14.2 11.5* 13.4 11.2*  HCT 41.9   < > 37.4* 42.4 35.4* 40.8 33.7*  MCV 88.6   < > 87.6 88.3 90.1 90.5 89.2  PLT 129*   < >  129* 189 284 409* 519*   < > = values in this interval not displayed.    Basic Metabolic Panel: Recent Labs  Lab 06/21/24 0441 06/22/24 0230 06/23/24 0421 06/24/24 0411 06/25/24 0334 06/26/24 0358  NA 137 139 138 140 137 138  K 2.7* 3.4* 3.9 3.4* 4.2 3.7  CL 93* 98 100 103 101 101  CO2 31 30 26 23 23 26   GLUCOSE 91 128* 92 91 101* 116*  BUN 7 7 <5* 8 8 9   CREATININE 0.58* 0.52* 0.53* 0.83 0.72 0.62  CALCIUM 8.4* 8.5* 9.1 7.7* 8.2* 7.6*  MG 0.9* 1.7 1.3* 1.6* 1.9 1.5*  PHOS 3.4 4.1 3.5  --  2.8 4.0   GFR: Estimated Creatinine Clearance: 119.5 mL/min (by C-G formula based on SCr of 0.62 mg/dL). Recent Labs  Lab 06/23/24 0421 06/24/24 0411 06/25/24 0334 06/26/24 0358  WBC 11.1* 12.2* 18.1* 18.8*    Liver Function Tests: Recent Labs  Lab 06/22/24 0230 06/23/24 0421 06/24/24 0411 06/25/24 0334 06/26/24 0358  AST 118* 88* 50* 54* 54*  ALT 48* 45* 28 25 22   ALKPHOS 148* 154* 140* 135* 111  BILITOT 1.0 1.1 0.6 0.3 <0.2  PROT 6.7 7.2 5.9* 6.9 5.9*  ALBUMIN 3.3*  3.2* 2.7* 2.9* 2.5*   Recent Labs  Lab 06/19/24 2312  LIPASE 21   No results for input(s): AMMONIA in the last 168 hours.  ABG    Component Value Date/Time   PHART 7.4 06/24/2024 0248   PCO2ART 44 06/24/2024 0248   PO2ART 96 06/24/2024 0248   HCO3 27.3 06/24/2024 0248   O2SAT 98.6 06/24/2024 0248     Coagulation Profile: Recent Labs  Lab 06/21/24 0441  INR 1.0    Cardiac Enzymes: No results for input(s): CKTOTAL, CKMB, CKMBINDEX, TROPONINI in the last 168 hours.  HbA1C: No results found for: HGBA1C  CBG: Recent Labs  Lab 06/25/24 1954 06/25/24 2346 06/26/24 0319 06/26/24 0751 06/26/24 1229  GLUCAP 122* 140* 110* 122* 114*    Review of Systems:   Unable to assess pt mechanically intubated   Past Medical History:  He,  has a past medical history of Alopecia, Dyspnea, GERD (gastroesophageal reflux disease), Hypertension, and Renal disorder.   Surgical History:    Past Surgical History:  Procedure Laterality Date   ORIF ANKLE FRACTURE Right 07/30/2017   Procedure: OPEN REDUCTION INTERNAL FIXATION (ORIF) ANKLE FRACTURE;  Surgeon: Tobie Priest, MD;  Location: [facility] ORS;  Service: Orthopedics;  Laterality: Right;   SPLENECTOMY  2008   SPLENECTOMY     INJURED PLAYING SPORTS AND HAD INTERNAL BLEEDING   SYNDESMOSIS REPAIR Right 07/30/2017   Procedure: POSSIBLE SYNDESMOSIS REPAIR;  Surgeon: Tobie Priest, MD;  Location: [facility] ORS;  Service: Orthopedics;  Laterality: Right;   WISDOM TOOTH EXTRACTION       Social History:   reports that he has never smoked. He has never used smokeless tobacco. He reports current alcohol use of about 21.0 standard drinks of alcohol per week. He reports current drug use. Drug: Other-see comments.   Family History:  His family history is not on file.   Allergies Allergies[1]   Home Medications  Prior to Admission medications  Medication Sig Start Date End Date Taking? Authorizing Provider  lisinopril  (ZESTRIL ) 5 MG tablet Take 1 tablet (5 mg total) by mouth daily. 11/11/21 11/11/22  Menshew, Candida LULLA Kings, PA-C     Critical care time: 40 minutes     Lonell Moose, [facility]  Pulmonary/Critical Care Pager [phone] (please enter 7 digits) PCCM Consult Pager [phone] (please enter 7 digits)            [1]  Allergies Allergen Reactions   Penicillins    "

## 2024-06-26 NOTE — Progress Notes (Signed)
 Eeg done

## 2024-06-26 NOTE — Procedures (Signed)
 Patient Name: Alejandro Collier  MRN: [id number]  Epilepsy Attending: Arlin MALVA Krebs  Referring Physician/Provider: Maranda Lonell MATSU, NP  Date: 06/26/2024 Duration: 39.07 mins  Patient history: 39yo M with seizure in setting of alcohol withdrawal, EEG to evaluate for seizure  Level of alertness: comatose  AEDs during EEG study: Propofol   Technical aspects: This EEG study was done with scalp electrodes positioned according to the 10-20 International system of electrode placement. Electrical activity was reviewed with band pass filter of 1-70Hz , sensitivity of 7 uV/mm, display speed of 15mm/sec with a 60Hz  notched filter applied as appropriate. EEG data were recorded continuously and digitally stored.  Video monitoring was available and reviewed as appropriate.  Description: EEG showed continuous generalized 3 to 6 Hz theta-delta slowing with overriding 15 to 18 Hz beta activity distributed symmetrically and diffusely. Hyperventilation and photic stimulation were not performed.     ABNORMALITY - Continuous slow, generalized  IMPRESSION: This study is suggestive of severe diffuse encephalopathy, nonspecific etiology but likely related to sedation. No seizures or epileptiform discharges were seen throughout the recording.  [name]

## 2024-06-27 LAB — BASIC METABOLIC PANEL WITH GFR
Anion gap: 10 (ref 5–15)
BUN: 6 mg/dL (ref 6–20)
CO2: 30 mmol/L (ref 22–32)
Calcium: 8.2 mg/dL — ABNORMAL LOW (ref 8.9–10.3)
Chloride: 103 mmol/L (ref 98–111)
Creatinine, Ser: 0.41 mg/dL — ABNORMAL LOW (ref 0.61–1.24)
GFR, Estimated: >60 mL/min
Glucose, Bld: 135 mg/dL — ABNORMAL HIGH (ref 70–99)
Potassium: 3.7 mmol/L (ref 3.5–5.1)
Sodium: 142 mmol/L (ref 135–145)

## 2024-06-27 LAB — BLOOD GAS, VENOUS
Acid-Base Excess: 8.6 mmol/L — ABNORMAL HIGH (ref 0.0–2.0)
Bicarbonate: 34.1 mmol/L — ABNORMAL HIGH (ref 20.0–28.0)
FIO2: 40 %
O2 Content: 40 L/min
O2 Saturation: 98.9 %
Patient temperature: 37
pCO2, Ven: 49 mmHg (ref 44–60)
pH, Ven: 7.45 — ABNORMAL HIGH (ref 7.25–7.43)
pO2, Ven: 95 mmHg — ABNORMAL HIGH (ref 32–45)

## 2024-06-27 LAB — GLUCOSE, CAPILLARY
Glucose-Capillary: 114 mg/dL — ABNORMAL HIGH (ref 70–99)
Glucose-Capillary: 113 mg/dL — ABNORMAL HIGH (ref 70–99)
Glucose-Capillary: 112 mg/dL — ABNORMAL HIGH (ref 70–99)
Glucose-Capillary: 120 mg/dL — ABNORMAL HIGH (ref 70–99)
Glucose-Capillary: 123 mg/dL — ABNORMAL HIGH (ref 70–99)

## 2024-06-27 LAB — CULTURE, RESPIRATORY W GRAM STAIN

## 2024-06-27 LAB — CBC
HCT: 37.2 % — ABNORMAL LOW (ref 39.0–52.0)
Hemoglobin: 12.1 g/dL — ABNORMAL LOW (ref 13.0–17.0)
MCH: 29.3 pg (ref 26.0–34.0)
MCHC: 32.5 g/dL (ref 30.0–36.0)
MCV: 90.1 fL (ref 80.0–100.0)
Platelets: 656 10*3/uL — ABNORMAL HIGH (ref 150–400)
RBC: 4.13 MIL/uL — ABNORMAL LOW (ref 4.22–5.81)
RDW: 15.9 % — ABNORMAL HIGH (ref 11.5–15.5)
WBC: 16.3 10*3/uL — ABNORMAL HIGH (ref 4.0–10.5)
nRBC: 0 % (ref 0.0–0.2)

## 2024-06-27 LAB — PHOSPHORUS: Phosphorus: 3.1 mg/dL (ref 2.5–4.6)

## 2024-06-27 LAB — MAGNESIUM
Magnesium: 1.6 mg/dL — ABNORMAL LOW (ref 1.7–2.4)
Magnesium: 1.9 mg/dL (ref 1.7–2.4)

## 2024-06-27 LAB — POTASSIUM: Potassium: 3.7 mmol/L (ref 3.5–5.1)

## 2024-06-27 MED ORDER — HYDRALAZINE HCL 20 MG/ML IJ SOLN
10.0000 mg | Freq: Four times a day (QID) | INTRAMUSCULAR | Status: DC | PRN
  Administered 2024-06-28 – 2024-06-30 (×3): 20 mg via INTRAVENOUS
  Administered 2024-07-01: 10 mg via INTRAVENOUS
  Filled 2024-06-27 (×4): qty 1

## 2024-06-27 MED ORDER — HALOPERIDOL LACTATE 5 MG/ML IJ SOLN
2.0000 mg | Freq: Three times a day (TID) | INTRAMUSCULAR | Status: DC
  Administered 2024-06-27 – 2024-06-28 (×5): 2 mg via INTRAVENOUS
  Filled 2024-06-27 (×5): qty 1

## 2024-06-27 MED ORDER — ENSURE PLUS HIGH PROTEIN PO LIQD
237.0000 mL | Freq: Three times a day (TID) | ORAL | Status: DC
  Administered 2024-06-27 – 2024-07-03 (×16): 237 mL via ORAL

## 2024-06-27 MED ORDER — DEXTROSE IN LACTATED RINGERS 5 % IV SOLN: INTRAVENOUS | Status: DC

## 2024-06-27 MED ORDER — LORAZEPAM 2 MG/ML IJ SOLN
2.0000 mg | Freq: Four times a day (QID) | INTRAMUSCULAR | Status: DC | PRN
  Administered 2024-06-28 (×2): 2 mg via INTRAVENOUS
  Filled 2024-06-27 (×2): qty 1

## 2024-06-27 MED ORDER — MAGNESIUM SULFATE 4 GM/100ML IV SOLN
4.0000 g | Freq: Once | INTRAVENOUS | Status: AC
  Administered 2024-06-27: 4 g via INTRAVENOUS
  Filled 2024-06-27: qty 100

## 2024-06-27 MED ORDER — GUAIFENESIN-DM 100-10 MG/5ML PO SYRP
5.0000 mL | ORAL_SOLUTION | ORAL | Status: DC | PRN
  Administered 2024-06-27: 5 mL via ORAL
  Filled 2024-06-27: qty 10

## 2024-06-27 NOTE — Evaluation (Signed)
 Clinical/Bedside Swallow Evaluation Patient Details  Name: JANOAH MENNA MRN: [id number] Date of Birth: February 22, 1986  Today's Date: 06/27/2024 Time: SLP Start Time (ACUTE ONLY): 1110 SLP Stop Time (ACUTE ONLY): 1126 SLP Time Calculation (min) (ACUTE ONLY): 16 min  Past Medical History:  Past Medical History:  Diagnosis Date   Alopecia    Dyspnea    PT STATES THIS IS CHRONIC AND THAT HE CAN WALK A MILE WITHOUT GETTING SOB OR HAVING CP   GERD (gastroesophageal reflux disease)    OCC-NO MEDS   Hypertension    PT NEVER WENT BACK AND GOT HIS LISINOPRIL  FILLED   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 IN 2016   Past Surgical History:  Past Surgical History:  Procedure Laterality Date   ORIF ANKLE FRACTURE Right 07/30/2017   Procedure: OPEN REDUCTION INTERNAL FIXATION (ORIF) ANKLE FRACTURE;  Surgeon: Tobie Priest, MD;  Location: [facility] ORS;  Service: Orthopedics;  Laterality: Right;   SPLENECTOMY  2008   SPLENECTOMY     INJURED PLAYING SPORTS AND HAD INTERNAL BLEEDING   SYNDESMOSIS REPAIR Right 07/30/2017   Procedure: POSSIBLE SYNDESMOSIS REPAIR;  Surgeon: Tobie Priest, MD;  Location: [facility] ORS;  Service: Orthopedics;  Laterality: Right;   WISDOM TOOTH EXTRACTION     HPI:  Jalene Lacko is a 39 year old male with past medical history significant for hypertension, GERD, renal disorder, chronic dyspnea and alcohol use disorder admitted on 06/19/2024 with alcohol withdrawal and seizure witnessed while he was at work. It was reported his last drink was on 1/19.  He does have a history of seizures with alcohol withdrawal over a year ago.     Pt's hospitalization has been complicated by high level of agitation, violence and combativenes requiring Precedex . Pt intubated on 06/24/2024 d/t increase in secretions following sedation for aggression.   Pt self-extubated on 06/26/2024. Psychiatry consulted.    Assessment / Plan / Recommendation  Clinical Impression  Pt with no  history of dysphagia. Pt in bilateral wrist restraints and mittens during this evaluation d/t recent bouts of agitation and combativeness. Pt was alert throughout, reports that he likes Ginger Ale.       During this swallow evaluation, pt presented with adequate oropharyngeal abilities when consuming thin liquids via straw, several bolues of applesauce and solid graham crackers. Pt's oral phase appeared timely with no oral residue visible when given another bolus. Pt's pharyngeal phase appeared swift when he was ocnsuming consecutive sips of thin liquids via straw. he typically ocnsumed ~ half of cup before releasing the straw. No overt s/s of aspiration were observed and no decline in vitals were present. At this time, recommend regular diet with thin liquids via straw, medicine whole in thin liquids. No further skilled ST services are indicated at this time. SLP Visit Diagnosis: Dysphagia, unspecified (R13.10)    Aspiration Risk  No limitations    Diet Recommendation    Regular with thin liquids, medicine whole with thin liquids       Other Recommendations Oral Care Recommendations: Oral care BID     Swallow Evaluation Recommendations Recommendations: PO diet PO Diet Recommendation: Regular;Thin liquids (Level 0) Liquid Administration via: Straw Medication Administration: Whole meds with liquid Supervision: Staff to assist with self-feeding;Full supervision/cueing for swallowing strategies Swallowing strategies  : Minimize environmental distractions;Slow rate;Small bites/sips Postural changes: Position pt fully upright for meals;Stay upright 30-60 min after meals Oral care recommendations: Oral care BID (2x/day)   Assistance Recommended at Discharge  TBD  Functional Status Assessment Patient has not had a recent decline in their functional status          Swallow Study   General Date of Onset: 06/19/24 HPI: Kallum Jorgensen is a 39 year old male with past medical history significant  for hypertension, GERD, renal disorder, chronic dyspnea and alcohol use disorder admitted on 06/19/2024 with alcohol withdrawal and seizure witnessed while he was at work. It was reported his last drink was on 1/19.  He does have a history of seizures with alcohol withdrawal over a year ago.     Pt's hospitalization has been complicated by high level of agitation, violence and combativenes requiring Precedex . Pt intubated on 06/24/2024 d/t increase in secretions following sedation for aggression.   Pt self-extubated on 06/26/2024. Psychiatry consulted. Type of Study: Bedside Swallow Evaluation Previous Swallow Assessment: none in chart Diet Prior to this Study: NPO Temperature Spikes Noted: No Respiratory Status: Room air History of Recent Intubation: Yes Total duration of intubation (days): 2 days Date extubated: 06/26/24 Behavior/Cognition: Alert Oral Cavity Assessment: Dry;Excessive secretions Oral Care Completed by SLP: Yes Oral Cavity - Dentition: Adequate natural dentition Self-Feeding Abilities: Total assist (d/t restraints) Patient Positioning: Upright in bed Baseline Vocal Quality: Normal Volitional Cough: Cognitively unable to elicit Volitional Swallow: Unable to elicit    Oral/Motor/Sensory Function Overall Oral Motor/Sensory Function: Within functional limits   Ice Chips Ice chips: Not tested   Thin Liquid Thin Liquid: Within functional limits Presentation: Straw    Nectar Thick Nectar Thick Liquid: Not tested   Honey Thick Honey Thick Liquid: Not tested   Puree Puree: Within functional limits Presentation: Spoon   Solid     Solid: Within functional limits     Jalie Eiland B. Rubbie, M.S., CCC-SLP, Tree Surgeon Certified Brain Injury Specialist [facility]  [facility] Office [id number] Ascom [phone] Fax [phone]

## 2024-06-27 NOTE — Progress Notes (Signed)
 PHARMACY CONSULT NOTE  Pharmacy Consult for Electrolyte Monitoring and Replacement   Recent Labs: Potassium (mmol/L)  Date Value  06/27/2024 3.7  02/20/2013 4.1   Magnesium  (mg/dL)  Date Value  98/72/7973 1.6 (L)   Calcium (mg/dL)  Date Value  98/72/7973 8.2 (L)   Calcium, Total (mg/dL)  Date Value  90/77/7985 8.6   Albumin (g/dL)  Date Value  98/73/7973 2.5 (L)   Phosphorus (mg/dL)  Date Value  98/72/7973 3.1   Sodium (mmol/L)  Date Value  06/27/2024 142  02/20/2013 137   Assessment: 39 y.o. male with medical history significant for Hypertension and alcohol use disorder being admitted with an alcohol withdrawal seizure, witnessed by his family. Pharmacy is asked to follow and replace electrolytes while in CCU  Goal of Therapy:  Electrolytes WNL  Plan:  --4 grams IV magnesium  sulfate x 1  --Re-check electrolytes in AM  Alejandro Collier, PharmD Candidate 06/27/2024 7:59 AM

## 2024-06-27 NOTE — Progress Notes (Signed)
 Updated pts mother Merrill Brewer via telephone regarding pts condition and current plan of care.  All questions answered will continue to monitor and assess pt.  Lonell Moose, AGNP  Pulmonary/Critical Care Pager [phone] (please enter 7 digits) PCCM Consult Pager [phone] (please enter 7 digits)

## 2024-06-27 NOTE — Progress Notes (Signed)
 "  NAME:  Alejandro Collier, MRN:  [id number], DOB:  10-18-1985, LOS: 7 ADMISSION DATE:  06/19/2024, CONSULTATION DATE:  06/23/24 REFERRING MD:  Dr. Jerelene, CHIEF COMPLAINT:  Seizures    Brief Synopsis:  39 year old male with a past medical history significant for hypertension, renal disorder (IGA/MCD Syndrome), GERD, chronic dyspnea and alcohol abuse, admitted for seizure secondary to alcohol withdrawal.  History of Present Illness:  Alejandro Collier is a 39 year old male with past medical history significant for hypertension, GERD, renal disorder, chronic dyspnea and alcohol use disorder admitted with alcohol withdrawal and seizure witnessed while he was at work. It was reported his last drink was on 1/19.  He does have a history of seizures with alcohol withdrawal over a year ago. He denied significant changes to his amount of alcohol intake prior to arrival.   ED Course: Upon arrival via EMS, it was reported he had one episode of vomiting en route. On presentation patient was tachycardic, tachypneic and hypotensive. He denied symptoms of nausea/vomiting, diarrhea or abdominal pain. Once arrived to the ED, he was acutely agitated requiring multiple doses of ativan  and started on IV fluids  Vitals on Arrival: Temp: 97.6 BP: 155/101  HR: 104 RR: 28 SpO2: 98% on room air  Pertinent Labs/Diagnostics: Notable for leukocytosis, EtOH <15, transaminitis, hyperbilirubinemia and metabolic acidosis. No electrolyte derangements  EKG Interpretation: SR, atypical RBBB, LVH  Chemistry:  Na+:138 K+: 3.6 BUN/Cr.: 7/0.80 Serum CO2/ AG: 20/26 Glucose: 126 AST/ALT: 210/70  Hematology:  WBC: 14.2 Hgb: 13.8 Platelets 129 INR: 1.0  ID: MRSA PCR: negative  Medications Administered:  IVF: 1L NaCl Thiamine  Zofran  4mg  Lorazepam    Hospital Course: See below listed under significant hospital events  Pertinent Medical History  Alcohol Use Disorder Hypertension Renal Disorder GERD  Micro Data:    01/22: MRSA PCR>>negative  01/25: Blood>>staph hominis/epidermidis   01/25: Resp>>moderate staph aureus   Anti-infectives (From admission, onward)    Start     Dose/Rate Route Frequency Ordered Stop   06/26/24 2200  vancomycin  (VANCOREADY) IVPB 750 mg/150 mL        750 mg 150 mL/hr over 60 Minutes Intravenous Every 8 hours 06/26/24 1304     06/26/24 1200  vancomycin  (VANCOREADY) IVPB 1250 mg/250 mL        1,250 mg 166.7 mL/hr over 90 Minutes Intravenous  Once 06/26/24 1040 06/26/24 1433   06/25/24 0200  piperacillin -tazobactam (ZOSYN ) IVPB 3.375 g        3.375 g 12.5 mL/hr over 240 Minutes Intravenous Every 8 hours 06/25/24 0125          Significant Hospital Events: Including procedures, antibiotic start and stop dates in addition to other pertinent events   1/19: Admitted following a witnessed seizure at home in the setting of alcohol withdrawal. Started on phenobarbital  taper with ativan  prn 1/20: Remained agitated with phenobarbital  and prn ativan  1/21: rapid response due to patient being agitated requiring high doses of Ativan  for alcohol withdraw was then started on phenobarbital  taper and on a Precedex  drip transferred to stepdown  1/22: Remained on precedex , phenobarb and prn ativan  with agitation and combativeness. 1/23: Still on precedex  gtt with acute agitation with spells of violence. PCCM consulted d/t precedex . Intubated for inability to protect airway after receiving phenobarbital  for increasing agitation overnight. 1/26: Pt remains mechanically intubated followed commands during WUA, however secretion burden precludes extubation.  Self extubated respiratory status stable on HHFNC, however pt agitated/delirious/violent with inability to redirect requiring haldol /precedex  gtt/bilateral  soft wrist restraints  1/27: Pt self extubated on 01/26 stable from respiratory standpoint currently on RA.  Remains on precedex  gtt with intermittent agitation.  Speech and psychiatry  consulted appreciate input   Interim History / Subjective:  As outlined above under significant events   Objective    Blood pressure 118/85, pulse 76, temperature 97.7 F (36.5 C), temperature source Axillary, resp. rate 10, height 5' 10 (1.778 m), weight 65.3 kg, SpO2 99%.    Vent Mode: PRVC FiO2 (%):  [24 %-40 %] 28 % Set Rate:  [16 bmp] 16 bmp Vt Set:  [450 mL] 450 mL PEEP:  [5 cmH20] 5 cmH20   Intake/Output Summary (Last 24 hours) at 06/27/2024 1058 Last data filed at 06/27/2024 0901 Gross per 24 hour  Intake 3388.83 ml  Output 5120 ml  Net -1731.17 ml   Filed Weights   06/25/24 0453 06/26/24 0439 06/27/24 0500  Weight: 67.3 kg 67.5 kg 65.3 kg    Examination: General: Acutely-ill appearing male, NAD mechanically intubated  HENT: Atraumatic, normocephalic, neck supple, no JVD CV: Sinus tachycardiac, no m/r/g, 2+ radial/2+ distal pulses, no edema  Pulm: Rhonchi throughout, even, non labored  GI: +BS x4, soft, non distended  Extremities: Normal bulk and tone, moves all extremities  Neuro: Sedated, follows commands, PERRL  GU: Indwelling foley draining yellow urine   Resolved problem list   Assessment and Plan   #Seizure due to ETOH withdrawal  #Acute toxic metabolic encephalopathy with severe agitation/delirium  - Correct metabolic derangements  - Maintain RASS goal 0 to -1 - Precedex  gtt and prn haldol  for agitation/delirium  - Requiring bilateral soft wrist restraints due to disruption of care will remove as tolerated  - Psychiatry consulted to assist with medication management appreciate input  - Continue thiamine , folic acid , and mvi  - Seizure precautions - WUA daily  - Will consult OT/PT once able to safely redirect   #Hypertension - Continuous telemetry monitoring - Prn hydralazine  and labetalol  for bp management (goal sbp >165 and/or dbp >100)   #Alcoholic Liver Injury/Disease #Transaminitis~improving RUQ US  01/22: cholelithiasis w/o evidence of  cholecystitis; probably steatosis - Trend hepatic function and monitor coags  - Constipation protocol   #Staph epi/hominis bacteremia  #Staph aureus pneumonia  - Trend WBC and monitor fever curve - Follow cultures  - Continue abx as outlined above pending culture results and sensitivities   #AGMA iso Alcoholic Ketoacidosis~resolved #Hypokalemia~improving   #Hypomagnesia  - Trend BMP - Strict I&O's  - Avoid nephrotoxins as able - Ensure adequate renal perfusion - Replace electrolytes as indicated  #Anemia no signs of active bleeding  #VTE Prophylaxis - Trend CBC - Transfuse if Hgb <7 or platelets <20,000 or active bleeding - Monitor for s/sx of bleeding - DVT prophylaxis: lovenox , SCDs  #Endo - CBG's q4hrs  - Target CBG readings 140 to 180 - Follow hypo/hyperglycemic protocol   Labs   CBC: Recent Labs  Lab 06/23/24 0421 06/24/24 0411 06/25/24 0334 06/26/24 0358 06/27/24 0430  WBC 11.1* 12.2* 18.1* 18.8* 16.3*  HGB 14.2 11.5* 13.4 11.2* 12.1*  HCT 42.4 35.4* 40.8 33.7* 37.2*  MCV 88.3 90.1 90.5 89.2 90.1  PLT 189 284 409* 519* 656*    Basic Metabolic Panel: Recent Labs  Lab 06/22/24 0230 06/23/24 0421 06/24/24 0411 06/25/24 0334 06/26/24 0358 06/26/24 1349 06/27/24 0016 06/27/24 0430  NA 139 138 140 137 138  --   --  142  K 3.4* 3.9 3.4* 4.2 3.7 3.4* 3.7 3.7  CL 98 100 103 101 101  --   --  103  CO2 30 26 23 23 26   --   --  30  GLUCOSE 128* 92 91 101* 116*  --   --  135*  BUN 7 <5* 8 8 9   --   --  6  CREATININE 0.52* 0.53* 0.83 0.72 0.62  --   --  0.41*  CALCIUM 8.5* 9.1 7.7* 8.2* 7.6*  --   --  8.2*  MG 1.7 1.3* 1.6* 1.9 1.5* 2.0  --  1.6*  PHOS 4.1 3.5  --  2.8 4.0  --   --  3.1   GFR: Estimated Creatinine Clearance: 115.6 mL/min (A) (by C-G formula based on SCr of 0.41 mg/dL (L)). Recent Labs  Lab 06/24/24 0411 06/25/24 0334 06/26/24 0358 06/27/24 0430  WBC 12.2* 18.1* 18.8* 16.3*    Liver Function Tests: Recent Labs  Lab  06/22/24 0230 06/23/24 0421 06/24/24 0411 06/25/24 0334 06/26/24 0358  AST 118* 88* 50* 54* 54*  ALT 48* 45* 28 25 22   ALKPHOS 148* 154* 140* 135* 111  BILITOT 1.0 1.1 0.6 0.3 <0.2  PROT 6.7 7.2 5.9* 6.9 5.9*  ALBUMIN 3.3* 3.2* 2.7* 2.9* 2.5*   No results for input(s): LIPASE, AMYLASE in the last 168 hours.  No results for input(s): AMMONIA in the last 168 hours.  ABG    Component Value Date/Time   PHART 7.4 06/24/2024 0248   PCO2ART 44 06/24/2024 0248   PO2ART 96 06/24/2024 0248   HCO3 34.1 (H) 06/27/2024 0016   O2SAT 98.9 06/27/2024 0016     Coagulation Profile: Recent Labs  Lab 06/21/24 0441  INR 1.0    Cardiac Enzymes: No results for input(s): CKTOTAL, CKMB, CKMBINDEX, TROPONINI in the last 168 hours.  HbA1C: No results found for: HGBA1C  CBG: Recent Labs  Lab 06/26/24 1616 06/26/24 1934 06/26/24 2314 06/27/24 0327 06/27/24 0712  GLUCAP 123* 111* 113* 114* 113*    Review of Systems:   Unable to assess pt encephalopathic  Past Medical History:  He,  has a past medical history of Alopecia, Dyspnea, GERD (gastroesophageal reflux disease), Hypertension, and Renal disorder.   Surgical History:   Past Surgical History:  Procedure Laterality Date   ORIF ANKLE FRACTURE Right 07/30/2017   Procedure: OPEN REDUCTION INTERNAL FIXATION (ORIF) ANKLE FRACTURE;  Surgeon: Tobie Priest, MD;  Location: [facility] ORS;  Service: Orthopedics;  Laterality: Right;   SPLENECTOMY  2008   SPLENECTOMY     INJURED PLAYING SPORTS AND HAD INTERNAL BLEEDING   SYNDESMOSIS REPAIR Right 07/30/2017   Procedure: POSSIBLE SYNDESMOSIS REPAIR;  Surgeon: Tobie Priest, MD;  Location: [facility] ORS;  Service: Orthopedics;  Laterality: Right;   WISDOM TOOTH EXTRACTION       Social History:   reports that he has never smoked. He has never used smokeless tobacco. He reports current alcohol use of about 21.0 standard drinks of alcohol per week. He reports current drug use. Drug:  Other-see comments.   Family History:  His family history is not on file.   Allergies Allergies[1]   Home Medications  Prior to Admission medications  Medication Sig Start Date End Date Taking? Authorizing Provider  lisinopril  (ZESTRIL ) 5 MG tablet Take 1 tablet (5 mg total) by mouth daily. 11/11/21 11/11/22  Menshew, Candida LULLA Kings, PA-C     Critical care time: 35 minutes     Lonell Moose, [facility]  Pulmonary/Critical Care Pager [phone] (please enter 7 digits) PCCM Consult Pager  [phone] (please enter 7 digits)            [1]  Allergies Allergen Reactions   Penicillins    "

## 2024-06-27 NOTE — Plan of Care (Signed)
" °  Problem: Education: Goal: Knowledge of General Education information will improve Description: Including pain rating scale, medication(s)/side effects and non-pharmacologic comfort measures Outcome: Progressing   Problem: Health Behavior/Discharge Planning: Goal: Ability to manage health-related needs will improve Outcome: Progressing   Problem: Clinical Measurements: Goal: Ability to maintain clinical measurements within normal limits will improve Outcome: Progressing Goal: Will remain free from infection Outcome: Progressing Goal: Diagnostic test results will improve Outcome: Progressing Goal: Respiratory complications will improve Outcome: Progressing Goal: Cardiovascular complication will be avoided Outcome: Progressing   Problem: Activity: Goal: Risk for activity intolerance will decrease Outcome: Progressing   Problem: Nutrition: Goal: Adequate nutrition will be maintained Outcome: Progressing   Problem: Coping: Goal: Level of anxiety will decrease Outcome: Progressing   Problem: Elimination: Goal: Will not experience complications related to bowel motility Outcome: Progressing Goal: Will not experience complications related to urinary retention Outcome: Progressing   Problem: Pain Managment: Goal: General experience of comfort will improve and/or be controlled Outcome: Progressing   Problem: Safety: Goal: Ability to remain free from injury will improve Outcome: Progressing   Problem: Skin Integrity: Goal: Risk for impaired skin integrity will decrease Outcome: Progressing   Problem: Safety: Goal: Violent Restraint(s) Outcome: Progressing   Problem: Safety: Goal: Non-violent Restraint(s) Outcome: Progressing   Problem: Education: Goal: Expressions of having a comfortable level of knowledge regarding the disease process will increase Outcome: Progressing   Problem: Coping: Goal: Ability to adjust to condition or change in health will  improve Outcome: Progressing Goal: Ability to identify appropriate support needs will improve Outcome: Progressing   Problem: Health Behavior/Discharge Planning: Goal: Compliance with prescribed medication regimen will improve Outcome: Progressing   Problem: Medication: Goal: Risk for medication side effects will decrease Outcome: Progressing   Problem: Clinical Measurements: Goal: Complications related to the disease process, condition or treatment will be avoided or minimized Outcome: Progressing Goal: Diagnostic test results will improve Outcome: Progressing   Problem: Safety: Goal: Verbalization of understanding the information provided will improve Outcome: Progressing   Problem: Self-Concept: Goal: Level of anxiety will decrease Outcome: Progressing Goal: Ability to verbalize feelings about condition will improve Outcome: Progressing   "

## 2024-06-27 NOTE — Consult Note (Signed)
 [facility] Psychiatric Consult Initial  Patient Name: .Alejandro Collier  MRN: [id number]  DOB: 08/25/1985  Consult Order details:  Orders (From admission, onward)     Start     Ordered   06/27/24 0926  IP CONSULT TO PSYCHIATRY       Ordering Provider: Maranda Lonell MATSU, NP  Provider:  Donnelly Mellow, MD  Question Answer Comment  Location [facility]   Reason for Consult? Pt admitted with ETOH withdrawal now with severe agitation/delirium/violent behavior      06/27/24 0926             Mode of Visit: In person    Psychiatry Consult Evaluation  Service Date: June 27, 2024 LOS:  LOS: 7 days  Chief Complaint Aggressive behaviors  Primary Psychiatric Diagnoses  Aggressive behaviors   Assessment  CLINICAL DECISION MAKING: ASSESSMENT: 39 year old male with severe alcohol use disorder and history of withdrawal seizures presenting with delirium and severe agitation in setting of alcohol withdrawal, status post self-extubation with violent behaviors requiring ongoing sedation.  PLAN:  -Recommended scheduled Haldol  2 mg TID to primary medical team for delirium management -Recommended Ativan  PRN for acute agitation -Full psychiatric evaluation deferred due to sedation requirements; will continue ongoing evaluation as patient's medical status improves -Psychiatry will continue collaboration with medical team for medication management -Recommendations communicated to primary medical team     Diagnoses:  Active Hospital problems: Principal Problem:   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, with unspecified complication (HCC) Active Problems:   Alcoholic liver disease   Essential hypertension   Leukocytosis   Thrombocytopenia   Transaminitis   Hypokalemia   Hypomagnesemia   Acute respiratory failure with hypoxia (HCC)   Seizure (HCC)   Elevated LFTs   Aspiration pneumonia (HCC)    Plan   ## Disposition:-Full psychiatric evaluation deferred due to  sedation requirements; will continue ongoing evaluation as patient's medical status improves -Psychiatry will continue collaboration with medical team for medication management  ## Psychiatric Medication Recommendations:  -Recommended scheduled Haldol  2 mg TID to primary medical team for delirium management -Recommended Ativan  PRN for acute agitation  ## Medical Decision Making Capacity: Not specifically addressed in this encounter  ## Further Work-up:  EKG- QTC: 06/19/2024 was 448 Labs: Ethanol, CMP, CBC, magnesium   ## Behavioral / Environmental: -Delirium Precautions: Delirium Interventions for Nursing and Staff: - RN to open blinds every AM. - To Bedside: Glasses, hearing aide, and pt's own shoes. Make available to patients. when possible and encourage use. - Encourage po fluids when appropriate, keep fluids within reach. - OOB to chair with meals. - Passive ROM exercises to all extremities with AM & PM care. - RN to assess orientation to person, time and place QAM and PRN. - Recommend extended visitation hours with familiar family/friends as feasible. - Staff to minimize disturbances at night. Turn off television when pt asleep or when not in use.    ## Safety and Observation Level:  - Based on my clinical evaluation, I estimate the patient to be at low risk of self harm in the current setting. - At this time, we recommend  routine. This decision is based on my review of the chart including patient's history and current presentation, interview of the patient, mental status examination, and consideration of suicide risk including evaluating suicidal ideation, plan, intent, suicidal or self-harm behaviors, risk factors, and protective factors. This judgment is based on our ability to directly address suicide risk, implement suicide prevention strategies, and develop  a safety plan while the patient is in the clinical setting. Please contact our team if there is a concern that risk level has  changed.  CSSR Risk Category:C-SSRS RISK CATEGORY:  Flowsheet Row ED to Hosp-Admission (Current) from 06/19/2024 in [facility] ICU[facility]CCU ED from 03/23/2024 in [facility] Emergency Department at [facility] ED from 07/26/2023 in [facility] Emergency Department at [facility]  C-SSRS RISK CATEGORY No Risk No Risk No Risk     Suicide Risk Assessment: Patient has following modifiable risk factors for suicide: pain, medical illness (ie new dx of cancer), which we are addressing by doing to assist with medical team in regards to medication management.  Patient has following non-modifiable or demographic risk factors for suicide: male gender  Patient has the following protective factors against suicide: Supportive family  Thank you for this consult request. Recommendations have been communicated to the primary team.  We will continue to round at this time.       History of Present Illness  Relevant Aspects of [facility]   Patient Report:  SUBJECTIVE: Psychiatry consulted by primary medical team for severe agitation. Per primary team, patient is 39 year old male with history of severe alcohol use disorder and previous alcohol withdrawal seizures who presented to hospital on 06/19/2024 with altered mental status and seizure in setting of alcohol withdrawal. Admitted to ICU where he required intubation. Self-extubated yesterday, oriented only to place and person at that time. Since extubation, patient delirious and agitated with violent behaviors toward staff. Primary team reports difficulty redirecting patient and continued need for Precedex  drip due to current agitation.  OBJECTIVE: Psychiatric evaluation limited due to patient currently on Precedex  drip. Patient on high flow nasal cannula. Prior to this provider entering room, patient observed speaking to self and mumbling phrases without anyone present. Patient able to state current year is 2026 and his name.  Disoriented to location, events leading to hospitalization, and month. Most recent EKG on 06/19/2024 shows QTc of 448.   Psychiatric and Social History  Psychiatric History:  Information collected from patient/chart  Prev Dx/Sx: Alcohol use disorder Current Psych Provider: UTA Home Meds (current): UTA Previous Med Trials:UTA  Therapy: UTA Prior Psych Hospitalization: UTA Prior Suicide attempt/Self Harm: UTA Prior Violence: UTA  Family Psych History: UTA Family Hx suicide: UTA  Social History:  Educational Hx: UTA Occupational Hx: Chief Financial Officer Hx: UTA Living Situation: UTA Spiritual Hx: UTA Access to weapons/lethal means: UTA   Substance History Alcohol: Significant use noted in chart review Last Drink :UTA Number of drinks per day :UTA History of alcohol withdrawal seizures: Noted in chart review History of DT's: Noted in chart review Tobacco: UTA Illicit drugs: UTA Prescription drug abuse: UTA Rehab hx: UTA  Exam Findings  Physical Exam: Reviewed and agree with the physical exam findings conducted by the medical provider Vital Signs:  Temp:  [97 F (36.1 C)-99.4 F (37.4 C)] 97.7 F (36.5 C) (01/27 0714) Pulse Rate:  [67-126] 76 (01/27 0945) Resp:  [0-30] 10 (01/27 0945) BP: (103-179)/(59-135) 118/85 (01/27 0900) SpO2:  [86 %-100 %] 99 % (01/27 1044) FiO2 (%):  [24 %-40 %] 28 % (01/27 0804) Weight:  [65.3 kg] 65.3 kg (01/27 0500) Blood pressure 118/85, pulse 76, temperature 97.7 F (36.5 C), temperature source Axillary, resp. rate 10, height 5' 10 (1.778 m), weight 65.3 kg, SpO2 99%. Body mass index is 20.66 kg/m.     Other History   These have been pulled in through the EMR,  reviewed, and updated if appropriate.  Family History:  The patient's family history is not on file.  Medical History: Past Medical History:  Diagnosis Date   Alopecia    Dyspnea    PT STATES THIS IS CHRONIC AND THAT HE CAN WALK A MILE WITHOUT GETTING SOB OR HAVING CP   GERD  (gastroesophageal reflux disease)    OCC-NO MEDS   Hypertension    PT NEVER WENT BACK AND GOT HIS LISINOPRIL  FILLED   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 IN 2016    Surgical History: Past Surgical History:  Procedure Laterality Date   ORIF ANKLE FRACTURE Right 07/30/2017   Procedure: OPEN REDUCTION INTERNAL FIXATION (ORIF) ANKLE FRACTURE;  Surgeon: Tobie Priest, MD;  Location: [facility] ORS;  Service: Orthopedics;  Laterality: Right;   SPLENECTOMY  2008   SPLENECTOMY     INJURED PLAYING SPORTS AND HAD INTERNAL BLEEDING   SYNDESMOSIS REPAIR Right 07/30/2017   Procedure: POSSIBLE SYNDESMOSIS REPAIR;  Surgeon: Tobie Priest, MD;  Location: [facility] ORS;  Service: Orthopedics;  Laterality: Right;   WISDOM TOOTH EXTRACTION       Medications:  Current Medications[1]  Allergies: Allergies[2]  A. Claudene, NP This note was created using Scientist, clinical (histocompatibility and immunogenetics). Please excuse any inadvertent transcription errors. Case was discussed with supervising physician Dr. Jadapalle who is agreeable with current plan.      [1]  Current Facility-Administered Medications:    0.9 %  sodium chloride  infusion, , Intravenous, PRN, Isadora Hose, MD, Stopped at 06/27/24 0610   acetaminophen  (TYLENOL ) tablet 650 mg, 650 mg, Oral, Q6H PRN **OR** acetaminophen  (TYLENOL ) suppository 650 mg, 650 mg, Rectal, Q6H PRN, Rust-Chester, Britton L, NP   Chlorhexidine  Gluconate Cloth 2 % PADS 6 each, 6 each, Topical, QHS, Ponnala, Shruthi, MD, 6 each at 06/26/24 2259   dexmedetomidine  (PRECEDEX ) 400 MCG/100ML (4 mcg/mL) infusion, 0-1.2 mcg/kg/hr, Intravenous, Titrated, Nelson, Dana G, NP, Last Rate: 20.3 mL/hr at 06/27/24 0947, 1.2 mcg/kg/hr at 06/27/24 0947   dextrose  5 % in lactated ringers  infusion, , Intravenous, Continuous, Dgayli, Khabib, MD   enoxaparin  (LOVENOX ) injection 40 mg, 40 mg, Subcutaneous, Q24H, Cleatus Hoof V, MD, 40 mg at 06/27/24 0831   folic acid  1 mg in sodium chloride  0.9  % 50 mL IVPB, 1 mg, Intravenous, Daily, Stopped at 06/27/24 0901 **OR** folic acid  (FOLVITE ) tablet 1 mg, 1 mg, Oral, Daily, Rust-Chester, Jenita L, NP   haloperidol  lactate (HALDOL ) injection 5 mg, 5 mg, Intravenous, Q6H PRN, Nelson, Dana G, NP, 5 mg at 06/27/24 0210   hydrALAZINE  (APRESOLINE ) injection 10 mg, 10 mg, Intravenous, Q6H PRN, Bousman, Karlie, PA-C, 10 mg at 06/27/24 0132   labetalol  (NORMODYNE ) injection 10 mg, 10 mg, Intravenous, Q2H PRN, Isadora Hose, MD, 10 mg at 06/27/24 0607   multivitamin with minerals tablet 1 tablet, 1 tablet, Oral, Daily, Rust-Chester, Jenita L, NP   ondansetron  (ZOFRAN ) tablet 4 mg, 4 mg, Oral, Q6H PRN **OR** ondansetron  (ZOFRAN ) injection 4 mg, 4 mg, Intravenous, Q6H PRN, Rust-Chester, Jenita CROME, NP   Oral care mouth rinse, 15 mL, Mouth Rinse, PRN, Dgayli, Khabib, MD   piperacillin -tazobactam (ZOSYN ) IVPB 3.375 g, 3.375 g, Intravenous, Q8H, Belue, Rankin RAMAN, RPH, Last Rate: 12.5 mL/hr at 06/27/24 0947, Infusion Verify at 06/27/24 0947   [COMPLETED] thiamine  (VITAMIN B1) 500 mg in sodium chloride  0.9 % 50 mL IVPB, 500 mg, Intravenous, Q8H, Stopped at 06/24/24 2242 **FOLLOWED BY** thiamine  (VITAMIN B1) 250 mg in sodium chloride  0.9 % 50 mL  IVPB, 250 mg, Intravenous, Q24H, Last Rate: 105 mL/hr at 06/27/24 0947, Infusion Verify at 06/27/24 0947 **FOLLOWED BY** [START ON 06/30/2024] thiamine  (VITAMIN B1) injection 100 mg, 100 mg, Intravenous, Q24H, Nada Adriana BIRCH, RPH   vancomycin  (VANCOREADY) IVPB 750 mg/150 mL, 750 mg, Intravenous, Q8H, Nada Adriana BIRCH, RPH, Stopped at 06/27/24 9390 [2]  Allergies Allergen Reactions   Penicillins

## 2024-06-27 NOTE — Progress Notes (Signed)
 Nutrition Follow Up Note   DOCUMENTATION CODES:   Not applicable  INTERVENTION:   Ensure Plus High Protein po TID, each supplement provides 350 kcal and 20 grams of protein  Continue thiamine , folic acid  and MVI daily   Pt remains at high refeed risk; recommend monitor potassium, magnesium  and phosphorus labs daily until stable  Daily weights   NUTRITION DIAGNOSIS:   Inadequate oral intake related to lethargy/confusion as evidenced by NPO status. 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  Pt self extubated 1/26. Visited pt's room today. Pt more alert but sleepy. Pt seen by SLP today and initiated on a regular diet. RD discussed with pt the importance of adequate nutrition needed to preserve lean muscle. Pt is agreeable to drink chocolate Ensure. Pt remains at refeed risk. Per chart, pt appears to be around his UBW currently.     Medications reviewed and include: lovenox , folic acid , MVI, thiamine , LRS w/ 5% dextrose @100ml /hr, zosyn , vancomycin    Labs reviewed: K 3.7 wnl, creat 0.41(L), P 3.1 wnl, Mg 1.9 wnl Wbc- 16.3(H) Cbgs- 112, 113, 114 x 24 hrs   UOP-   Diet Order:   Diet Order             Diet regular Fluid consistency: Thin  Diet effective now                  EDUCATION NEEDS:   No education needs have been identified at this time  Skin:  Skin Assessment: Reviewed RN Assessment  Last BM:  1/26- type 7  Height:   Ht Readings from Last 1 Encounters:  06/19/24 5' 10 (1.778 m)    Weight:   Wt Readings from Last 1 Encounters:  06/27/24 65.3  kg    Ideal Body Weight:  75.4 kg  BMI:  Body mass index is 20.66 kg/m.  Estimated Nutritional Needs:   Kcal:  2100-2400kcal/day  Protein:  105-120g/day  Fluid:  2.1-2.4L/day  Augustin Shams MS, RD, LDN If unable to be reached, please send secure chat to RD inpatient available from 8:00a-4:00p daily

## 2024-06-27 NOTE — TOC Progression Note (Signed)
 Transition of Care [facility]) - Progression Note    Patient Details  Name: KYRELL RUACHO MRN: [id number] Date of Birth: Dec 29, 1985  [facility]) CM/SW Contact  [name], LCSW Phone Number: 06/27/2024, 2:14 PM  Clinical Narrative:    Chart reviewed. I received a secure chat from NP that the mother of the patient would like to speak with SW. SW called the patient mother. I introduced myself, my role, and reason for call. The patient mother reports that the patient is needing substance use treatment. The patient mother would like resources. SW will email the patient mother SU resources in [place].   SW has also reached out to [employer] for assistance for placement.  SW also reached out to the insurance department and to ensure that the patient will be screened for insurance. Vernell wade confirmed.                      Expected Discharge Plan and Services                                               Social Drivers of Health (SDOH) Interventions SDOH Screenings   Food Insecurity: No Food Insecurity (06/20/2024)  Housing: Low Risk (06/20/2024)  Transportation Needs: No Transportation Needs (06/20/2024)  Utilities: Not At Risk (06/20/2024)  Tobacco Use: Low Risk (06/19/2024)    Readmission Risk Interventions     No data to display

## 2024-06-27 NOTE — Progress Notes (Signed)
 Consult received from inpatient care management for assistance with inpatient substance abuse treatment. Documentation reveals patient is alert to self only, and is currently requiring total care for all ADLs. This disqualifies him from inpatient substance abuse treatment. Will follow along to see how patient progresses and provide assistance, as needed.

## 2024-06-28 DIAGNOSIS — E8729 Other acidosis: Secondary | ICD-10-CM

## 2024-06-28 DIAGNOSIS — D75839 Thrombocytosis, unspecified: Secondary | ICD-10-CM

## 2024-06-28 DIAGNOSIS — J15211 Pneumonia due to Methicillin susceptible Staphylococcus aureus: Secondary | ICD-10-CM

## 2024-06-28 LAB — CULTURE, BLOOD (ROUTINE X 2)

## 2024-06-28 LAB — BASIC METABOLIC PANEL WITH GFR
Anion gap: 14 (ref 5–15)
BUN: 6 mg/dL (ref 6–20)
CO2: 26 mmol/L (ref 22–32)
Calcium: 8.7 mg/dL — ABNORMAL LOW (ref 8.9–10.3)
Chloride: 96 mmol/L — ABNORMAL LOW (ref 98–111)
Creatinine, Ser: 0.51 mg/dL — ABNORMAL LOW (ref 0.61–1.24)
GFR, Estimated: >60 mL/min
Glucose, Bld: 106 mg/dL — ABNORMAL HIGH (ref 70–99)
Potassium: 3.6 mmol/L (ref 3.5–5.1)
Sodium: 135 mmol/L (ref 135–145)

## 2024-06-28 LAB — CBC
HCT: 38.7 % — ABNORMAL LOW (ref 39.0–52.0)
Hemoglobin: 12.4 g/dL — ABNORMAL LOW (ref 13.0–17.0)
MCH: 28.8 pg (ref 26.0–34.0)
MCHC: 32 g/dL (ref 30.0–36.0)
MCV: 90 fL (ref 80.0–100.0)
Platelets: 852 10*3/uL — ABNORMAL HIGH (ref 150–400)
RBC: 4.3 MIL/uL (ref 4.22–5.81)
RDW: 15.5 % (ref 11.5–15.5)
WBC: 17.4 10*3/uL — ABNORMAL HIGH (ref 4.0–10.5)
nRBC: 0 % (ref 0.0–0.2)

## 2024-06-28 LAB — PHOSPHORUS: Phosphorus: 2.6 mg/dL (ref 2.5–4.6)

## 2024-06-28 LAB — MAGNESIUM: Magnesium: 1.6 mg/dL — ABNORMAL LOW (ref 1.7–2.4)

## 2024-06-28 MED ORDER — CHLORDIAZEPOXIDE HCL 10 MG PO CAPS
10.0000 mg | ORAL_CAPSULE | Freq: Three times a day (TID) | ORAL | Status: DC
  Administered 2024-06-28 – 2024-06-30 (×9): 10 mg via ORAL
  Filled 2024-06-28 (×9): qty 1

## 2024-06-28 MED ORDER — MAGNESIUM SULFATE 2 GM/50ML IV SOLN
2.0000 g | Freq: Once | INTRAVENOUS | Status: AC
  Administered 2024-06-28: 2 g via INTRAVENOUS
  Filled 2024-06-28: qty 50

## 2024-06-28 MED ORDER — CLONIDINE HCL 0.1 MG PO TABS
0.1000 mg | ORAL_TABLET | Freq: Two times a day (BID) | ORAL | Status: DC
  Administered 2024-06-28 – 2024-06-29 (×3): 0.1 mg via ORAL
  Filled 2024-06-28 (×3): qty 1

## 2024-06-28 MED ORDER — CEFAZOLIN SODIUM-DEXTROSE 2-4 GM/100ML-% IV SOLN
2.0000 g | Freq: Three times a day (TID) | INTRAVENOUS | Status: DC
  Administered 2024-06-28 – 2024-07-03 (×15): 2 g via INTRAVENOUS
  Filled 2024-06-28 (×15): qty 100

## 2024-06-28 MED ORDER — LORAZEPAM 2 MG/ML IJ SOLN: 2.0000 mg | Freq: Four times a day (QID) | INTRAMUSCULAR | Status: DC | PRN

## 2024-06-28 MED ORDER — LABETALOL HCL 5 MG/ML IV SOLN
10.0000 mg | INTRAVENOUS | Status: DC | PRN
  Administered 2024-06-28: 20 mg via INTRAVENOUS
  Filled 2024-06-28: qty 4

## 2024-06-28 MED ORDER — METOPROLOL SUCCINATE ER 25 MG PO TB24
25.0000 mg | ORAL_TABLET | Freq: Every day | ORAL | Status: DC
  Administered 2024-06-28 – 2024-06-29 (×2): 25 mg via ORAL
  Filled 2024-06-28 (×2): qty 1

## 2024-06-28 NOTE — Progress Notes (Signed)
 Inpatient Rehab Admissions Coordinator:   Per therapy recommendations pt was screened for CIR by Reche Lowers, PT, DPT.  At this time pt appears to be a potential candidate for CIR so I will place an order for full assessment per our protocol.  Note that pt's parent inquiring about SA resources/rehab; we would not plan on discharging to a SA rehabilitation from CIR as pt's typically need to be independent in order to qualify.  Will need to confirm caregiver support during CIR assessment.   Reche Lowers, PT, DPT Admissions Coordinator [phone] 06/28/24 12:56 PM

## 2024-06-28 NOTE — Assessment & Plan Note (Signed)
 Transaminitis. Needs to stop drinking alcohol.

## 2024-06-28 NOTE — Plan of Care (Signed)
   Problem: Health Behavior/Discharge Planning: Goal: Ability to manage health-related needs will improve Outcome: Progressing

## 2024-06-28 NOTE — Evaluation (Signed)
 Occupational Therapy Evaluation Patient Details Name: Alejandro Collier MRN: [id number] DOB: Oct 24, 1985 Today's Date: 06/28/2024   History of Present Illness   Pt is a 39 y/o M admitted on 06/19/24 after presenting with seizure following alcohol withdrawal. Pt intubated on 06/23/24, self extubated on 06/26/24. PMH: HTN, renal disorder (IGA/MCD syndrome), GERD, chronic dyspnea, alcohol abuse     Clinical Impressions Patient presenting with decreased Ind in self care, balance,functional mobility, transfers, endurance, and safety awareness. Patient reports living at home with uncle and being Ind at baseline. He works full time job but does not drive. Pt is very cooperative this session and pleasant. He does need increased time to process, sequence, and initiate. Pt ambulates to sink for standing grooming tasks with min HHA for balance. Increased time to sequence steps. Pt then ambulates 150' with min A with several stops secondary to elevated HR 130's to 140's. Patient will benefit from acute OT to increase overall independence in the areas of ADLs, functional mobility, and safety awareness in order to safely discharge.     If plan is discharge home, recommend the following:   A little help with walking and/or transfers;A little help with bathing/dressing/bathroom;Assistance with cooking/housework;Direct supervision/assist for medications management;Direct supervision/assist for financial management;Assist for transportation;Help with stairs or ramp for entrance;Supervision due to cognitive status     Functional Status Assessment   Patient has had a recent decline in their functional status and demonstrates the ability to make significant improvements in function in a reasonable and predictable amount of time.     Equipment Recommendations   Other (comment) (defer)      Precautions/Restrictions   Precautions Precautions: Fall Precaution/Restrictions Comments: seizures, watch HR      Mobility Bed Mobility Overal bed mobility: Needs Assistance Bed Mobility: Sidelying to Sit, Sit to Supine   Sidelying to sit: Supervision, HOB elevated   Sit to supine: Supervision        Transfers Overall transfer level: Needs assistance Equipment used: 1 person hand held assist Transfers: Sit to/from Stand Sit to Stand: Min assist                  Balance Overall balance assessment: Needs assistance Sitting-balance support: Feet supported Sitting balance-Leahy Scale: Good     Standing balance support: Single extremity supported, No upper extremity supported, During functional activity Standing balance-Leahy Scale: Poor                             ADL either performed or assessed with clinical judgement   ADL Overall ADL's : Needs assistance/impaired     Grooming: Wash/dry hands;Oral care;Wash/dry face;Standing;Contact guard assist;Minimal assistance                                       Vision Patient Visual Report: No change from baseline              Pertinent Vitals/Pain Pain Assessment Pain Assessment: No/denies pain     Extremity/Trunk Assessment Upper Extremity Assessment Upper Extremity Assessment: Generalized weakness   Lower Extremity Assessment Lower Extremity Assessment: Generalized weakness       Communication Communication Communication: Impaired Factors Affecting Communication: Reduced clarity of speech   Cognition Arousal: Alert Behavior During Therapy: Flat affect Cognition: Cognition impaired   Orientation impairments: Time Awareness: Intellectual awareness impaired, Online awareness impaired   Attention impairment (select  first level of impairment): Sustained attention Executive functioning impairment (select all impairments): Sequencing, Reasoning, Problem solving                   Following commands: Impaired Following commands impaired: Follows one step commands with increased  time     Cueing  General Comments   Cueing Techniques: Verbal cues  HR 136-147 bpm during session (sitting EOB, walking), nurse & MD made aware           Home Living Family/patient expects to be discharged to:: Private residence Living Arrangements: Other (Comment) (uncle) Available Help at Discharge: Family;Available 24 hours/day Type of Home: Apartment Home Access: Level entry     Home Layout: One level     Bathroom Shower/Tub: Walk-in shower         Home Equipment: None      Lives With:  Civil Engineer, Contracting)    Prior Functioning/Environment Prior Level of Function : Independent/Modified Independent;Working/employed                    OT Problem List: Decreased strength;Decreased activity tolerance;Impaired balance (sitting and/or standing);Decreased safety awareness;Decreased cognition   OT Treatment/Interventions: Self-care/ADL training;Balance training;Therapeutic exercise;Neuromuscular education;Therapeutic activities;Energy conservation;Cognitive remediation/compensation;Patient/family education      OT Goals(Current goals can be found in the care plan section)   Acute Rehab OT Goals Patient Stated Goal: to get stronger OT Goal Formulation: With patient Time For Goal Achievement: 07/12/24 Potential to Achieve Goals: Fair ADL Goals Pt Will Perform Grooming: with modified independence;standing Pt Will Perform Lower Body Dressing: with modified independence;sit to/from stand Pt Will Transfer to Toilet: with modified independence;ambulating Pt Will Perform Toileting - Clothing Manipulation and hygiene: with modified independence;sit to/from stand   OT Frequency:  Min 3X/week    Co-evaluation PT/OT/SLP Co-Evaluation/Treatment: Yes Reason for Co-Treatment: Other (comment) (fatigue) PT goals addressed during session: Mobility/safety with mobility;Balance OT goals addressed during session: ADL's and self-care      AM-PAC OT 6 Clicks Daily Activity      Outcome Measure Help from another person eating meals?: None Help from another person taking care of personal grooming?: A Little Help from another person toileting, which includes using toliet, bedpan, or urinal?: A Little Help from another person bathing (including washing, rinsing, drying)?: A Little Help from another person to put on and taking off regular upper body clothing?: A Little Help from another person to put on and taking off regular lower body clothing?: A Little 6 Click Score: 19   End of Session Nurse Communication: Mobility status  Activity Tolerance: Patient limited by fatigue Patient left: in bed;with call bell/phone within reach;with bed alarm set  OT Visit Diagnosis: Unsteadiness on feet (R26.81);Muscle weakness (generalized) (M62.81)                Time: 8993-8971 OT Time Calculation (min): 22 min Charges:  OT General Charges $OT Visit: 1 Visit OT Evaluation $OT Eval Moderate Complexity: [address] MS, OTR/L , CBIS ascom [phone]  06/28/24, 3:01 PM

## 2024-06-28 NOTE — Assessment & Plan Note (Signed)
 Titrate clonidine  to 3 times daily dosing and continue Toprol 

## 2024-06-28 NOTE — Assessment & Plan Note (Addendum)
 Patient intubated on 1/23.  Self extubated on 1/26.  Patient off oxygen.

## 2024-06-28 NOTE — Progress Notes (Signed)
 " Progress Note   Patient: Alejandro Collier FMW:[id number] DOB: [birth year] DOA: 06/19/2024     8 DOS: the patient was seen and examined on 06/28/2024   Brief hospital course: 39 year old male with past medical history significant for hypertension, GERD, renal disorder, chronic dyspnea and alcohol use disorder admitted with alcohol withdrawal and seizure witnessed while he was at work. It was reported his last drink was on 1/19.  He does have a history of seizures with alcohol withdrawal over a year ago. He denied significant changes to his amount of alcohol intake prior to arrival.    ED Course: Upon arrival via EMS, it was reported he had one episode of vomiting en route. On presentation patient was tachycardic, tachypneic and hypotensive. He denied symptoms of nausea/vomiting, diarrhea or abdominal pain. Once arrived to the ED, he was acutely agitated requiring multiple doses of ativan  and started on IV fluids  1/19: Admitted following a witnessed seizure at home in the setting of alcohol withdrawal. Started on phenobarbital  taper with ativan  prn 1/20: Remained agitated with phenobarbital  and prn ativan  1/21: rapid response due to patient being agitated requiring high doses of Ativan  for alcohol withdraw was then started on phenobarbital  taper and on a Precedex  drip transferred to stepdown  1/22: Remained on precedex , phenobarb and prn ativan  with agitation and combativeness. 1/23: Still on precedex  gtt with acute agitation with spells of violence. PCCM consulted d/t precedex . Intubated for inability to protect airway after receiving phenobarbital  for increasing agitation overnight. 1/26: Pt remains mechanically intubated followed commands during WUA, however secretion burden precludes extubation.  Self extubated respiratory status stable on HHFNC, however pt agitated/delirious/violent with inability to redirect requiring haldol /precedex  gtt/bilateral soft wrist restraints  1/27: Pt self extubated on  01/26 stable from respiratory standpoint currently on RA.  Remains on precedex  gtt with intermittent agitation.  Speech and psychiatry consulted appreciate input   06/28/24.  Patient transferred to medical service.  Still having a little tremor will put on low-dose Librium .  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  Seizure likely secondary to alcohol withdrawal.  EEG did not show any seizure activity.  Staphylococcus aureus pneumonia (HCC) Change vancomycin  and Zosyn  to ancef .  staphylococcus hominis and epidermidis seen in 1 blood culture bottle likely skin contaminant.  Alcoholic liver disease Transaminitis. Needs to stop drinking alcohol.  Essential hypertension Start low-dose clonidine  and Toprol   Hypokalemia Replaced  Hypomagnesemia Replace IV again today.  Leukocytosis Likely reactive on admission and has been resolved.  Thrombocytopenia Likely secondary to alcoholic liver disease. - Continue to monitor  Alcoholic ketoacidosis Improved  Thrombocytosis Likely secondary to pneumonia and folic acid .  Initially had thrombocytopenia  Acute respiratory failure with hypoxia [facility]) Patient intubated on 1/23.  Self extubated on 1/26.  Patient off oxygen today.        Subjective: Patient with less cough.  Feeling okay.  Blood pressure and heart rate high today.  Admitted with alcohol withdrawal seizure.  Physical Exam: Vitals:   06/28/24 0538 06/28/24 0735 06/28/24 0900 06/28/24 1150  BP: (!) 167/115 (!) 178/125  123/73  Pulse: (!) 115 (!) 108 (!) 145 (!) 122  Resp: 17   18  Temp: 98.2 F (36.8 C) 98.4 F (36.9 C)  98.9 F (37.2 C)  TempSrc: Oral   Oral  SpO2: 94% 99%  97%  Weight:  Height:       Physical Exam HENT:     Head: Normocephalic.  Eyes:     General: Lids are normal.     Conjunctiva/sclera:  Conjunctivae normal.  Cardiovascular:     Rate and Rhythm: Regular rhythm. Tachycardia present.     Heart sounds: Normal heart sounds, S1 normal and S2 normal.  Pulmonary:     Breath sounds: No decreased breath sounds, wheezing, rhonchi or rales.  Abdominal:     Palpations: Abdomen is soft.     Tenderness: There is no abdominal tenderness.  Musculoskeletal:     Right lower leg: No swelling.     Left lower leg: No swelling.  Skin:    General: Skin is warm.     Findings: No rash.  Neurological:     Mental Status: He is alert.     Comments: Very slight tremor     Data Reviewed: White blood cell count 17.4, hemoglobin 12.4, platelet count 852, creatinine 0.51, potassium 3.6  Family Communication: Family on the phone and at the bedside  Disposition: Status is: Inpatient Remains inpatient appropriate because: PT recommending acute inpatient rehab  Planned Discharge Destination: Possibly acute inpatient rehab    Time spent: 28 minutes  Author: Charlie Patterson, MD 06/28/2024 1:14 PM  For on call review [email or web address].  "

## 2024-06-28 NOTE — Assessment & Plan Note (Addendum)
 Case discussed with hematology.  Likely reactive in nature.  Patient initially had thrombocytopenia on admission.  Treating for pneumonia.  Continue to monitor.

## 2024-06-28 NOTE — Assessment & Plan Note (Addendum)
 No recurrent seizure.  Seizure likely secondary to alcohol withdrawal.  EEG did not show any seizure activity.  Received phenobarbital  taper prior.  Received high-dose thiamine .  Placed on low-dose Librium  on 1/28 and will taper down to twice a day on 1/31.  MRI of the brain ordered since I do not see 1 on record here.

## 2024-06-28 NOTE — Assessment & Plan Note (Addendum)
 Received supplementation during the hospital course.  No further supplementation upon discharge since on Aldactone 

## 2024-06-28 NOTE — Assessment & Plan Note (Addendum)
 Replaced

## 2024-06-28 NOTE — Evaluation (Signed)
 Speech Language Pathology Evaluation Patient Details Name: Alejandro Collier MRN: [id number] DOB: 1985-07-06 Today's Date: 06/28/2024 Time: 8599-8586 SLP Time Calculation (min) (ACUTE ONLY): 13 min  Problem List:  Patient Active Problem List   Diagnosis Date Noted   Staphylococcus aureus pneumonia (HCC) 06/28/2024   Thrombocytosis 06/28/2024   Alcoholic ketoacidosis 06/28/2024   Seizure (HCC) 06/26/2024   Elevated LFTs 06/26/2024   Aspiration pneumonia (HCC) 06/26/2024   Acute respiratory failure with hypoxia (HCC) 06/24/2024   Leukocytosis 06/21/2024   Thrombocytopenia 06/21/2024   Transaminitis 06/21/2024   Hypokalemia 06/21/2024   Hypomagnesemia 06/21/2024   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, with unspecified complication (HCC) 06/20/2024   Alcoholic liver disease 06/20/2024   Essential hypertension 06/20/2024   Past Medical History:  Past Medical History:  Diagnosis Date   Alopecia    Dyspnea    PT STATES THIS IS CHRONIC AND THAT HE CAN WALK A MILE WITHOUT GETTING SOB OR HAVING CP   GERD (gastroesophageal reflux disease)    OCC-NO MEDS   Hypertension    PT NEVER WENT BACK AND GOT HIS LISINOPRIL  FILLED   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 IN 2016   Past Surgical History:  Past Surgical History:  Procedure Laterality Date   ORIF ANKLE FRACTURE Right 07/30/2017   Procedure: OPEN REDUCTION INTERNAL FIXATION (ORIF) ANKLE FRACTURE;  Surgeon: Tobie Priest, MD;  Location: [facility] ORS;  Service: Orthopedics;  Laterality: Right;   SPLENECTOMY  2008   SPLENECTOMY     INJURED PLAYING SPORTS AND HAD INTERNAL BLEEDING   SYNDESMOSIS REPAIR Right 07/30/2017   Procedure: POSSIBLE SYNDESMOSIS REPAIR;  Surgeon: Tobie Priest, MD;  Location: [facility] ORS;  Service: Orthopedics;  Laterality: Right;   WISDOM TOOTH EXTRACTION     HPI:  Alejandro Collier is a 39 year old male with past medical history significant for hypertension, GERD, renal disorder, chronic dyspnea  and alcohol use disorder admitted on 06/19/2024 with alcohol withdrawal and seizure witnessed while he was at work. It was reported his last drink was on 1/19.  He does have a history of seizures with alcohol withdrawal over a year ago.     Pt's hospitalization has been complicated by high level of agitation, violence and combativenes requiring Precedex . Pt intubated on 06/24/2024 d/t increase in secretions following sedation for aggression.   Pt self-extubated on 06/26/2024. Psychiatry consulted.   Assessment / Plan / Recommendation Clinical Impression  Pt was awake, alert, sitting edge of bed, cooperative, eager to participate. He presents with cognitive communication deficits in verbal complex selective attention, memory (short term, recall of information from brief story, potential storage deficits), moderately complex problem solving, and intellectual awareness/safety awareness.   Pt;s strengthens include overall orientation, expressive and receptive language, is independent with basic yes/no questions, moderate difficulty with evaluating more abstract yes/no questions, is able to consistently follow 1 step directions with emerging ability to follow 2 and 3 step directions.   Skilled ST services will follow for continued cognitive communication support with recommendation of intense therapy at next venue of care as pt states he lived independently, worked full time (didn't drive) and lived with his uncle.     SLP Assessment  SLP Recommendation/Assessment: Patient needs continued Speech Language Pathology Services SLP Visit Diagnosis: Cognitive communication deficit (R41.841)     Assistance Recommended at Discharge  Intermittent Supervision/Assistance  Functional Status Assessment Patient has had a recent decline in their functional status and demonstrates the ability to make significant improvements in function  in a reasonable and predictable amount of time.  Frequency and Duration min 2x/week   2 weeks      SLP Evaluation Cognition  Overall Cognitive Status: No family/caregiver present to determine baseline cognitive functioning Arousal/Alertness: Awake/alert Orientation Level: Oriented X4 Year: 2026 Month: January Attention: Selective Selective Attention: Impaired Selective Attention Impairment: Verbal complex;Functional complex Memory: Impaired Memory Impairment: Retrieval deficit;Decreased short term memory Decreased Short Term Memory: Verbal basic;Verbal complex;Functional basic;Functional complex Awareness: Impaired Awareness Impairment: Intellectual impairment Problem Solving: Impaired Problem Solving Impairment: Verbal complex;Functional complex Executive Function:  (all impaired by lower level deficits) Safety/Judgment: Impaired       Comprehension  Auditory Comprehension Overall Auditory Comprehension: Appears within functional limits for tasks assessed (impaired d/t cognitive deficits) Yes/No Questions:  (d/t cognitive deficits) Commands:  (d/t cognitive deficits) Visual Recognition/Discrimination Discrimination: Not tested Reading Comprehension Reading Status: Not tested    Expression Expression Primary Mode of Expression: Verbal Verbal Expression Overall Verbal Expression: Appears within functional limits for tasks assessed   Oral / Motor  Oral Motor/Sensory Function Overall Oral Motor/Sensory Function: Within functional limits Motor Speech Overall Motor Speech: Appears within functional limits for tasks assessed           Macaria Bias B. Rubbie, M.S., CCC-SLP, Tree Surgeon Certified Brain Injury Specialist [facility]  [facility] Office [phone] Ascom [phone] Fax [phone]

## 2024-06-28 NOTE — Evaluation (Signed)
 Physical Therapy Evaluation Patient Details Name: Alejandro Collier MRN: [id number] DOB: 1985-12-22 Today's Date: 06/28/2024  History of Present Illness  Pt is a 39 y/o M admitted on 06/19/24 after presenting with seizure following alcohol withdrawal. Pt intubated on 06/23/24, self extubated on 06/26/24. PMH: HTN, renal disorder (IGA/MCD syndrome), GERD, chronic dyspnea, alcohol abuse  Clinical Impression  Pt seen for PT evaluation with co-tx with OT 2/2 pt's request 2/2 significant fatigue this AM. Pt pleasant throughout session, follows simple commands with extra time. Pt reports prior to admission he was independent, working. On this date, pt requires min assist for ambulation without AD, demonstrates impaired gait pattern (wide BOS, decreased reciprocal arm swing, decreased dorsiflexion BLE, L knee genu recurvatum in stance phase). Pt presents with increased fall risk & need for caregiver assistance. Recommend ongoing acute PT services as well as post acute rehab >3 hours therapy/day upon d/c.        If plan is discharge home, recommend the following: A little help with walking and/or transfers;A little help with bathing/dressing/bathroom;Assistance with cooking/housework;Assist for transportation;Direct supervision/assist for medications management;Help with stairs or ramp for entrance;Direct supervision/assist for financial management;Supervision due to cognitive status   Can travel by private vehicle        Equipment Recommendations Other (comment) (TBD)  Recommendations for Other Services  Speech consult    Functional Status Assessment Patient has had a recent decline in their functional status and demonstrates the ability to make significant improvements in function in a reasonable and predictable amount of time.     Precautions / Restrictions Precautions Precautions: Fall Precaution/Restrictions Comments: seizures, watch HR Restrictions Weight Bearing Restrictions Per Provider Order:  No      Mobility  Bed Mobility Overal bed mobility: Needs Assistance Bed Mobility: Sidelying to Sit, Sit to Supine   Sidelying to sit: Supervision, HOB elevated   Sit to supine: Modified independent (Device/Increase time), HOB elevated        Transfers Overall transfer level: Needs assistance Equipment used: None Transfers: Sit to/from Stand Sit to Stand: Min assist                Ambulation/Gait Ambulation/Gait assistance: Min assist Gait Distance (Feet): 180 Feet Assistive device: None, 1 person hand held assist Gait Pattern/deviations: Decreased step length - left, Decreased step length - right, Decreased dorsiflexion - left, Decreased stride length, Decreased dorsiflexion - right, Wide base of support Gait velocity: decreased     General Gait Details: L knee genu recurvatum in stance phase, decreased BUE reciprocal arm swing  Stairs            Wheelchair Mobility     Tilt Bed    Modified Rankin (Stroke Patients Only)       Balance Overall balance assessment: Needs assistance Sitting-balance support: Feet supported Sitting balance-Leahy Scale: Good     Standing balance support: Single extremity supported, No upper extremity supported, During functional activity Standing balance-Leahy Scale: Poor                               Pertinent Vitals/Pain Pain Assessment Pain Assessment: No/denies pain    Home Living Family/patient expects to be discharged to:: Private residence Living Arrangements: Other (Comment) (uncle) Available Help at Discharge: Family;Available 24 hours/day Type of Home: Apartment Home Access: Level entry       Home Layout: One level Home Equipment: None      Prior Function Prior Level of  Function : Independent/Modified Independent;Working/employed                     Extremity/Trunk Assessment   Upper Extremity Assessment Upper Extremity Assessment: Generalized weakness    Lower  Extremity Assessment Lower Extremity Assessment: Generalized weakness       Communication   Communication Communication: Impaired Factors Affecting Communication: Reduced clarity of speech (low volume)    Cognition Arousal: Alert Behavior During Therapy: Flat affect   PT - Cognitive impairments: Awareness, Memory, Attention, Initiation, Sequencing, Safety/Judgement, Problem solving                       PT - Cognition Comments: significantly increased processing time Following commands: Impaired Following commands impaired: Follows one step commands with increased time     Cueing Cueing Techniques: Verbal cues     General Comments General comments (skin integrity, edema, etc.): HR 136-147 bpm during session (sitting EOB, walking), nurse & MD made aware    Exercises     Assessment/Plan    PT Assessment Patient needs continued PT services  PT Problem List Decreased strength;Decreased coordination;Decreased cognition;Decreased range of motion;Decreased activity tolerance;Decreased balance;Decreased mobility;Decreased knowledge of precautions;Decreased knowledge of use of DME;Decreased safety awareness       PT Treatment Interventions DME instruction;Therapeutic exercise;Gait training;Balance training;Stair training;Neuromuscular re-education;Functional mobility training;Cognitive remediation;Therapeutic activities;Patient/family education    PT Goals (Current goals can be found in the Care Plan section)  Acute Rehab PT Goals Patient Stated Goal: get better PT Goal Formulation: With patient Time For Goal Achievement: 07/12/24 Potential to Achieve Goals: Good    Frequency Min 3X/week     Co-evaluation PT/OT/SLP Co-Evaluation/Treatment: Yes Reason for Co-Treatment: Other (comment) (pt fatigued) PT goals addressed during session: Mobility/safety with mobility;Balance         AM-PAC PT 6 Clicks Mobility  Outcome Measure Help needed turning from your back  to your side while in a flat bed without using bedrails?: None Help needed moving from lying on your back to sitting on the side of a flat bed without using bedrails?: A Little Help needed moving to and from a bed to a chair (including a wheelchair)?: A Little Help needed standing up from a chair using your arms (e.g., wheelchair or bedside chair)?: A Little Help needed to walk in hospital room?: A Little Help needed climbing 3-5 steps with a railing? : A Little 6 Click Score: 19    End of Session   Activity Tolerance: Patient tolerated treatment well Patient left: in bed;with call bell/phone within reach;with bed alarm set Nurse Communication: Mobility status PT Visit Diagnosis: Unsteadiness on feet (R26.81);Other abnormalities of gait and mobility (R26.89);Muscle weakness (generalized) (M62.81);Difficulty in walking, not elsewhere classified (R26.2)    Time: 8986-8971 PT Time Calculation (min) (ACUTE ONLY): 15 min   Charges:   PT Evaluation $PT Eval Moderate Complexity: 1 Mod   PT General Charges $$ ACUTE PT VISIT: 1 Visit         [name], PT, DPT 06/28/24, 11:04 AM   [name] 06/28/2024, 11:02 AM

## 2024-06-28 NOTE — Assessment & Plan Note (Addendum)
 Changed vancomycin  and Zosyn  to ancef  on 1/28.  staphylococcus hominis and epidermidis seen in 1 blood culture bottle likely skin contaminant.  White blood cell count still elevated but slightly better than yesterday.

## 2024-06-28 NOTE — Consult Note (Signed)
 [facility] Psychiatric Consult Follow Up  Patient Name: .ABUBAKR WIEMAN  MRN: [id number]  DOB: 1985-09-01  Consult Order details:  Orders (From admission, onward)     Start     Ordered   06/27/24 0926  IP CONSULT TO PSYCHIATRY       Ordering Provider: Maranda Lonell MATSU, NP  Provider:  Donnelly Mellow, MD  Question Answer Comment  Location [facility]   Reason for Consult? Pt admitted with ETOH withdrawal now with severe agitation/delirium/violent behavior      06/27/24 0926             Mode of Visit: In person    Psychiatry Consult Evaluation  Service Date: June 28, 2024 LOS:  LOS: 8 days  Chief Complaint Aggressive behaviors  Primary Psychiatric Diagnoses  Aggressive behaviors   Assessment  CLINICAL DECISION MAKING: ASSESSMENT: 39 year old male with severe alcohol use disorder and history of withdrawal seizures presenting with delirium and severe agitation in setting of alcohol withdrawal, status post self-extubation with violent behaviors requiring ongoing sedation.  PLAN:  -Recommended scheduled Haldol  2 mg TID to primary medical team for delirium management -Recommended Ativan  PRN for acute agitation -Psychiatry will continue collaboration with medical team for medication management -Recommendations communicated to primary medical team   06/28/2024: Patient seen on rounds today, significantly more alert with intact orientation and no evidence of suicidal/homicidal ideations, hallucinations, or psychosis on examination; endorses consuming one-fifth of liquor daily for over one year and was agreeable to substance use resources coordinated through social work. Behaviors improving following severe agitation post-self-extubation with scheduled Haldol  2 mg three times daily recommended; patient declined medication management at this time, and psychiatry will continue to follow during medical hospitalization to assist with ongoing assessment and  medication adjustments as needed.  Diagnoses:  Active Hospital problems: Principal Problem:   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, with unspecified complication (HCC) Active Problems:   Alcoholic liver disease   Essential hypertension   Leukocytosis   Thrombocytopenia   Transaminitis   Hypokalemia   Hypomagnesemia   Acute respiratory failure with hypoxia (HCC)   Seizure (HCC)   Elevated LFTs   Aspiration pneumonia (HCC)    Plan   ## Disposition: -Psychiatry will continue collaboration with medical team for medication management  ## Psychiatric Medication Recommendations:  -Recommended scheduled Haldol  2 mg TID to primary medical team for delirium management -Recommended Ativan  PRN for acute agitation  ## Medical Decision Making Capacity: Not specifically addressed in this encounter  ## Further Work-up:  EKG- QTC: 06/19/2024 was 448 Labs: Ethanol, CMP, CBC, magnesium   ## Behavioral / Environmental: -Delirium Precautions: Delirium Interventions for Nursing and Staff: - RN to open blinds every AM. - To Bedside: Glasses, hearing aide, and pt's own shoes. Make available to patients. when possible and encourage use. - Encourage po fluids when appropriate, keep fluids within reach. - OOB to chair with meals. - Passive ROM exercises to all extremities with AM & PM care. - RN to assess orientation to person, time and place QAM and PRN. - Recommend extended visitation hours with familiar family/friends as feasible. - Staff to minimize disturbances at night. Turn off television when pt asleep or when not in use.    ## Safety and Observation Level:  - Based on my clinical evaluation, I estimate the patient to be at low risk of self harm in the current setting. - At this time, we recommend  routine. This decision is based on my review of the  chart including patient's history and current presentation, interview of the patient, mental status examination, and consideration of suicide risk  including evaluating suicidal ideation, plan, intent, suicidal or self-harm behaviors, risk factors, and protective factors. This judgment is based on our ability to directly address suicide risk, implement suicide prevention strategies, and develop a safety plan while the patient is in the clinical setting. Please contact our team if there is a concern that risk level has changed.  CSSR Risk Category:C-SSRS RISK CATEGORY:  Flowsheet Row ED to Hosp-Admission (Current) from 06/19/2024 in [facility] 1C MEDICAL TELEMETRY ED from 03/23/2024 in [facility] Emergency Department at [facility] ED from 07/26/2023 in [facility] Emergency Department at [facility]  C-SSRS RISK CATEGORY No Risk No Risk No Risk     Suicide Risk Assessment: Patient has following modifiable risk factors for suicide: pain, medical illness (ie new dx of cancer), which we are addressing by doing to assist with medical team in regards to medication management.  Patient has following non-modifiable or demographic risk factors for suicide: male gender  Patient has the following protective factors against suicide: Supportive family  Thank you for this consult request. Recommendations have been communicated to the primary team.  We will continue to round at this time.       History of Present Illness  Relevant Aspects of [facility]   Patient Report:  SUBJECTIVE: Psychiatry consulted by primary medical team for severe agitation. Per primary team, patient is 39 year old male with history of severe alcohol use disorder and previous alcohol withdrawal seizures who presented to hospital on 06/19/2024 with altered mental status and seizure in setting of alcohol withdrawal. Admitted to ICU where he required intubation. Self-extubated yesterday, oriented only to place and person at that time. Since extubation, patient delirious and agitated with violent behaviors toward staff. Primary team reports  difficulty redirecting patient and continued need for Precedex  drip due to current agitation.  OBJECTIVE: Psychiatric evaluation limited due to patient currently on Precedex  drip. Patient on high flow nasal cannula. Prior to this provider entering room, patient observed speaking to self and mumbling phrases without anyone present. Patient able to state current year is 2026 and his name. Disoriented to location, events leading to hospitalization, and month. Most recent EKG on 06/19/2024 shows QTc of 448.  06/28/2024: Patient was seen on rounds today by psychiatry and appeared significantly more alert compared to yesterday's assessment. Patient demonstrated intact orientation to person, place, and time, accurately reporting he is currently at [facility], the year is 2026, and the month is January. He reports currently residing with his uncle and describes having a good relationship with his family. Patient denies current suicidal ideations, homicidal ideations, and auditory or visual hallucinations. On current mental status examination, there was no evidence of psychosis, mania, or patient responding to internal stimuli.  Regarding substance use history, patient endorses consuming approximately one-fifth of liquor daily, which has been ongoing for over one year. He denied drinking alcohol with intent to kill or harm himself. Patient denies any history of previous suicide attempts, self-harm attempts, or prior inpatient psychiatric admissions.  On today's assessment, patient was agreeable to this provider coordinating with social work to provide substance use treatment resources for outpatient follow-up. Patient declined medication management at this time. Of note, psychiatry recommended scheduled Haldol  2 mg three times daily due to severe aggressive behaviors that occurred following patient's self-extubation. Patient's behaviors appear to be improving with current interventions. Psychiatry will continue to  follow patient throughout medical hospitalization to provide ongoing consultation to the medical team and assist with medication adjustments as clinically indicated. Social work was contacted by this provider today regarding need for substance use referral information and resources.     Psychiatric and Social History  Psychiatric History:  Information collected from patient/chart  Prev Dx/Sx: Alcohol use disorder Current Psych Provider: UTA Home Meds (current): UTA Previous Med Trials:UTA  Therapy: UTA Prior Psych Hospitalization: UTA Prior Suicide attempt/Self Harm: UTA Prior Violence: UTA  Family Psych History: UTA Family Hx suicide: UTA  Social History:  Educational Hx: UTA Occupational Hx: UTA Legal Hx: UTA Living Situation: UTA Spiritual Hx: UTA Access to weapons/lethal means: UTA   Substance History Alcohol: Significant use noted in chart review Last Drink :UTA Number of drinks per day :UTA History of alcohol withdrawal seizures: Noted in chart review History of DT's: Noted in chart review Tobacco: UTA Illicit drugs: UTA Prescription drug abuse: UTA Rehab hx: UTA  Exam Findings  Physical Exam: Reviewed and agree with the physical exam findings conducted by the medical provider Vital Signs:  Temp:  [98 F (36.7 C)-100.1 F (37.8 C)] 98.4 F (36.9 C) (01/28 0735) Pulse Rate:  [76-145] 145 (01/28 0900) Resp:  [12-28] 17 (01/28 0538) BP: (132-178)/(94-131) 178/125 (01/28 0735) SpO2:  [94 %-100 %] 99 % (01/28 0735) Weight:  [68.9 kg] 68.9 kg (01/28 0500) Blood pressure (!) 178/125, pulse (!) 145, temperature 98.4 F (36.9 C), resp. rate 17, height 5' 10 (1.778 m), weight 68.9 kg, SpO2 99%. Body mass index is 21.79 kg/m.     Other History   These have been pulled in through the EMR, reviewed, and updated if appropriate.  Family History:  The patient's family history is not on file.  Medical History: Past Medical History:  Diagnosis Date   Alopecia     Dyspnea    PT STATES THIS IS CHRONIC AND THAT HE CAN WALK A MILE WITHOUT GETTING SOB OR HAVING CP   GERD (gastroesophageal reflux disease)    OCC-NO MEDS   Hypertension    PT NEVER WENT BACK AND GOT HIS LISINOPRIL  FILLED   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 IN 2016    Surgical History: Past Surgical History:  Procedure Laterality Date   ORIF ANKLE FRACTURE Right 07/30/2017   Procedure: OPEN REDUCTION INTERNAL FIXATION (ORIF) ANKLE FRACTURE;  Surgeon: Tobie Priest, MD;  Location: [facility] ORS;  Service: Orthopedics;  Laterality: Right;   SPLENECTOMY  2008   SPLENECTOMY     INJURED PLAYING SPORTS AND HAD INTERNAL BLEEDING   SYNDESMOSIS REPAIR Right 07/30/2017   Procedure: POSSIBLE SYNDESMOSIS REPAIR;  Surgeon: Tobie Priest, MD;  Location: [facility] ORS;  Service: Orthopedics;  Laterality: Right;   WISDOM TOOTH EXTRACTION       Medications:  Current Medications[1]  Allergies: Allergies[2]  A. Claudene, NP This note was created using Scientist, clinical (histocompatibility and immunogenetics). Please excuse any inadvertent transcription errors. Case was discussed with supervising physician Dr. Jadapalle who is agreeable with current plan.      [1]  Current Facility-Administered Medications:    acetaminophen  (TYLENOL ) tablet 650 mg, 650 mg, Oral, Q6H PRN, 650 mg at 06/28/24 0247 **OR** acetaminophen  (TYLENOL ) suppository 650 mg, 650 mg, Rectal, Q6H PRN, Rust-Chester, Britton L, NP   ceFAZolin  (ANCEF ) IVPB 2g/100 mL premix, 2 g, Intravenous, Q8H, Wieting, Richard, MD   chlordiazePOXIDE  (LIBRIUM ) capsule 10 mg, 10 mg, Oral, TID, Josette, Richard, MD   Chlorhexidine  Gluconate Cloth 2 %  PADS 6 each, 6 each, Topical, QHS, Ponnala, Shruthi, MD, 6 each at 06/27/24 2140   cloNIDine  (CATAPRES ) tablet 0.1 mg, 0.1 mg, Oral, BID, Josette, Richard, MD, 0.1 mg at 06/28/24 0935   enoxaparin  (LOVENOX ) injection 40 mg, 40 mg, Subcutaneous, Q24H, Cleatus Delayne GAILS, MD, 40 mg at 06/28/24 0935   feeding  supplement (ENSURE PLUS HIGH PROTEIN) liquid 237 mL, 237 mL, Oral, TID BM, Dgayli, Khabib, MD, 237 mL at 06/28/24 0934   folic acid  1 mg in sodium chloride  0.9 % 50 mL IVPB, 1 mg, Intravenous, Daily, Stopped at 06/27/24 0901 **OR** folic acid  (FOLVITE ) tablet 1 mg, 1 mg, Oral, Daily, Rust-Chester, Jenita L, NP, 1 mg at 06/28/24 0935   guaiFENesin -dextromethorphan  (ROBITUSSIN DM) 100-10 MG/5ML syrup 5 mL, 5 mL, Oral, Q4H PRN, Isadora, Khabib, MD, 5 mL at 06/27/24 1800   haloperidol  lactate (HALDOL ) injection 2 mg, 2 mg, Intravenous, TID, Claudene Sham B, NP, 2 mg at 06/28/24 9062   hydrALAZINE  (APRESOLINE ) injection 10-20 mg, 10-20 mg, Intravenous, Q6H PRN, Bousman, Karlie, PA-C, 20 mg at 06/28/24 0735   labetalol  (NORMODYNE ) injection 10-20 mg, 10-20 mg, Intravenous, Q2H PRN, Bousman, Karlie, PA-C, 20 mg at 06/28/24 9388   LORazepam  (ATIVAN ) injection 2 mg, 2 mg, Intravenous, Q6H PRN, Claudene Sham B, NP, 2 mg at 06/28/24 0248   magnesium  sulfate IVPB 2 g 50 mL, 2 g, Intravenous, Once, Josette Ade, MD, Last Rate: 50 mL/hr at 06/28/24 0946, 2 g at 06/28/24 0946   multivitamin with minerals tablet 1 tablet, 1 tablet, Oral, Daily, Rust-Chester, Jenita CROME, NP, 1 tablet at 06/28/24 0935   ondansetron  (ZOFRAN ) tablet 4 mg, 4 mg, Oral, Q6H PRN **OR** ondansetron  (ZOFRAN ) injection 4 mg, 4 mg, Intravenous, Q6H PRN, Rust-Chester, Jenita CROME, NP   Oral care mouth rinse, 15 mL, Mouth Rinse, PRN, Isadora, Belva, MD   [COMPLETED] thiamine  (VITAMIN B1) 500 mg in sodium chloride  0.9 % 50 mL IVPB, 500 mg, Intravenous, Q8H, Stopped at 06/24/24 2242 **FOLLOWED BY** thiamine  (VITAMIN B1) 250 mg in sodium chloride  0.9 % 50 mL IVPB, 250 mg, Intravenous, Q24H, Stopped at 06/27/24 1009 **FOLLOWED BY** [START ON 06/30/2024] thiamine  (VITAMIN B1) injection 100 mg, 100 mg, Intravenous, Q24H, Nada Adriana BIRCH, RPH [2] No Active Allergies

## 2024-06-28 NOTE — Assessment & Plan Note (Signed)
-  Improved

## 2024-06-29 DIAGNOSIS — R7989 Other specified abnormal findings of blood chemistry: Secondary | ICD-10-CM

## 2024-06-29 LAB — CBC
HCT: 36.5 % — ABNORMAL LOW (ref 39.0–52.0)
Hemoglobin: 11.9 g/dL — ABNORMAL LOW (ref 13.0–17.0)
MCH: 28.7 pg (ref 26.0–34.0)
MCHC: 32.6 g/dL (ref 30.0–36.0)
MCV: 88.2 fL (ref 80.0–100.0)
Platelets: 865 10*3/uL — ABNORMAL HIGH (ref 150–400)
RBC: 4.14 MIL/uL — ABNORMAL LOW (ref 4.22–5.81)
RDW: 15.1 % (ref 11.5–15.5)
WBC: 19.6 10*3/uL — ABNORMAL HIGH (ref 4.0–10.5)
nRBC: 0 % (ref 0.0–0.2)

## 2024-06-29 LAB — BASIC METABOLIC PANEL WITH GFR
Anion gap: 12 (ref 5–15)
BUN: 8 mg/dL (ref 6–20)
CO2: 26 mmol/L (ref 22–32)
Calcium: 8.8 mg/dL — ABNORMAL LOW (ref 8.9–10.3)
Chloride: 97 mmol/L — ABNORMAL LOW (ref 98–111)
Creatinine, Ser: 0.55 mg/dL — ABNORMAL LOW (ref 0.61–1.24)
GFR, Estimated: >60 mL/min
Glucose, Bld: 106 mg/dL — ABNORMAL HIGH (ref 70–99)
Potassium: 3.8 mmol/L (ref 3.5–5.1)
Sodium: 134 mmol/L — ABNORMAL LOW (ref 135–145)

## 2024-06-29 LAB — MAGNESIUM: Magnesium: 1.6 mg/dL — ABNORMAL LOW (ref 1.7–2.4)

## 2024-06-29 MED ORDER — HALOPERIDOL LACTATE 5 MG/ML IJ SOLN
2.0000 mg | Freq: Two times a day (BID) | INTRAMUSCULAR | Status: DC
  Administered 2024-06-29 – 2024-06-30 (×3): 2 mg via INTRAVENOUS
  Filled 2024-06-29 (×3): qty 1

## 2024-06-29 MED ORDER — MAGNESIUM SULFATE 2 GM/50ML IV SOLN
2.0000 g | Freq: Once | INTRAVENOUS | Status: AC
  Administered 2024-06-29: 2 g via INTRAVENOUS
  Filled 2024-06-29: qty 50

## 2024-06-29 MED ORDER — CLONIDINE HCL 0.1 MG PO TABS
0.1000 mg | ORAL_TABLET | Freq: Three times a day (TID) | ORAL | Status: DC
  Administered 2024-06-29 – 2024-07-01 (×8): 0.1 mg via ORAL
  Filled 2024-06-29 (×8): qty 1

## 2024-06-29 NOTE — Progress Notes (Signed)
 " Progress Note   Patient: Alejandro Collier FMW:[id number] DOB: [birth year] DOA: 06/19/2024     9 DOS: the patient was seen and examined on 06/29/2024   Brief hospital course: 39 year old male with past medical history significant for hypertension, GERD, renal disorder, chronic dyspnea and alcohol use disorder admitted with alcohol withdrawal and seizure witnessed while he was at work. It was reported his last drink was on 1/19.  He does have a history of seizures with alcohol withdrawal over a year ago. He denied significant changes to his amount of alcohol intake prior to arrival.    ED Course: Upon arrival via EMS, it was reported he had one episode of vomiting en route. On presentation patient was tachycardic, tachypneic and hypotensive. He denied symptoms of nausea/vomiting, diarrhea or abdominal pain. Once arrived to the ED, he was acutely agitated requiring multiple doses of ativan  and started on IV fluids  1/19: Admitted following a witnessed seizure at home in the setting of alcohol withdrawal. Started on phenobarbital  taper with ativan  prn 1/20: Remained agitated with phenobarbital  and prn ativan  1/21: rapid response due to patient being agitated requiring high doses of Ativan  for alcohol withdraw was then started on phenobarbital  taper and on a Precedex  drip transferred to stepdown  1/22: Remained on precedex , phenobarb and prn ativan  with agitation and combativeness. 1/23: Still on precedex  gtt with acute agitation with spells of violence. PCCM consulted d/t precedex . Intubated for inability to protect airway after receiving phenobarbital  for increasing agitation overnight. 1/26: Pt remains mechanically intubated followed commands during WUA, however secretion burden precludes extubation.  Self extubated respiratory status stable on HHFNC, however pt agitated/delirious/violent with inability to redirect requiring haldol /precedex  gtt/bilateral soft wrist restraints  1/27: Pt self extubated on  01/26 stable from respiratory standpoint currently on RA.  Remains on precedex  gtt with intermittent agitation.  Speech and psychiatry consulted appreciate input   06/28/24.  Patient transferred to medical service.  Still having a little tremor will put on low-dose Librium .  Blood pressure and heart rate high will start low-dose clonidine  and Toprol .  Replace IV magnesium . 06/29/2024.  Looking into acute inpatient rehab.  Will titrate clonidine  up to 3 times daily dosing.  Not seeing a tremor today.  Discontinue folic acid  with thrombocytosis.  Replace IV magnesium  again today.  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  Seizure likely secondary to alcohol withdrawal.  EEG did not show any seizure activity.  Received phenobarbital  taper prior.  Received high-dose thiamine .  Placed on low-dose Librium  on 1/28.  Staphylococcus aureus pneumonia (HCC) Change vancomycin  and Zosyn  to ancef  on 1/28.  staphylococcus hominis and epidermidis seen in 1 blood culture bottle likely skin contaminant.  Alcoholic liver disease Transaminitis. Needs to stop drinking alcohol.  Essential hypertension Titrate clonidine  to 3 times daily dosing and continue Toprol   Hypokalemia Replaced  Hypomagnesemia Replace IV again today.  Leukocytosis White blood cell count trending up along with platelet count.  Continue to monitor  Thrombocytopenia Likely secondary to alcoholic liver disease. - Continue to monitor  Alcoholic ketoacidosis Improved  Thrombocytosis Likely secondary to pneumonia and folic acid .  Initially had thrombocytopenia secondary to alcohol.  Will discontinue folic acid .  Acute respiratory failure with hypoxia [facility]) Patient intubated on 1/23.  Self extubated on 1/26.  Patient off oxygen today.        Subjective: Patient feeling better today.  Blood pressure up and down.  Less shakiness today.  Admitted  with alcohol  withdrawal seizure  Physical Exam: Vitals:   06/29/24 0412 06/29/24 0500 06/29/24 0806 06/29/24 1156  BP: (!) 157/114  (!) 148/127 (!) 144/112  Pulse: 91  86 90  Resp: 20     Temp: (!) 97.4 F (36.3 C)  98.2 F (36.8 C) 98.8 F (37.1 C)  TempSrc:   Oral Oral  SpO2: 100%  100% 99%  Weight:  62.5 kg    Height:       Physical Exam HENT:     Head: Normocephalic.  Eyes:     General: Lids are normal.     Conjunctiva/sclera: Conjunctivae normal.  Cardiovascular:     Rate and Rhythm: Regular rhythm. Tachycardia present.     Heart sounds: Normal heart sounds, S1 normal and S2 normal.  Pulmonary:     Breath sounds: No decreased breath sounds, wheezing, rhonchi or rales.  Abdominal:     Palpations: Abdomen is soft.     Tenderness: There is no abdominal tenderness.  Musculoskeletal:     Right lower leg: No swelling.     Left lower leg: No swelling.  Skin:    General: Skin is warm.     Findings: No rash.  Neurological:     Mental Status: He is alert.     Comments: Did not see a tremor while I was in the room     Data Reviewed: Creatinine 0.55, sodium 134, magnesium  1.6, white blood cell count 19.6, hemoglobin 11.9, platelet count 865  Family Communication: Updated mother on the phone  Disposition: Status is: Inpatient Remains inpatient appropriate because: Looking into acute inpatient rehab  Planned Discharge Destination: Potentially acute inpatient rehab    Time spent: 28 minutes  Author: Charlie Patterson, MD 06/29/2024 12:50 PM  For on call review [email or web address].  "

## 2024-06-29 NOTE — Plan of Care (Signed)
  Problem: Education: Goal: Knowledge of General Education information will improve Description: Including pain rating scale, medication(s)/side effects and non-pharmacologic comfort measures Outcome: Progressing   Problem: Health Behavior/Discharge Planning: Goal: Ability to manage health-related needs will improve Outcome: Progressing   Problem: Clinical Measurements: Goal: Ability to maintain clinical measurements within normal limits will improve Outcome: Progressing Goal: Will remain free from infection Outcome: Progressing   Problem: Coping: Goal: Level of anxiety will decrease Outcome: Progressing   Problem: Elimination: Goal: Will not experience complications related to bowel motility Outcome: Progressing   Problem: Pain Managment: Goal: General experience of comfort will improve and/or be controlled Outcome: Progressing   Problem: Safety: Goal: Ability to remain free from injury will improve Outcome: Progressing   Problem: Skin Integrity: Goal: Risk for impaired skin integrity will decrease Outcome: Progressing

## 2024-06-29 NOTE — Assessment & Plan Note (Addendum)
 White blood cell count trending up along with platelet count.  Continue to monitor.  Likely reactive in nature.  On antibiotics for pneumonia.

## 2024-06-29 NOTE — Progress Notes (Signed)
 Inpatient Rehab Admissions Coordinator:    I spoke with Pt.'s mother regarding potential CIR admit. She states that all family works and cannot provide 24/7 assist. She also states she thinks he needs rehab for his alcoholism before he can come home regardless. States that he lived with his uncle, but that he cannot return there at this time. I will reach out to [facility] and let that know that CIR will not pursue admission at this time.   Leita Kleine, MS, CCC-SLP Rehab Admissions Coordinator  [phone] (celll) [phone] (office)

## 2024-06-29 NOTE — TOC Progression Note (Signed)
 Transition of Care [facility]) - Progression Note    Patient Details  Name: Alejandro Collier MRN: [id number] Date of Birth: 12/28/1985  [facility] CM/SW Contact  Grayce JAYSON Perfect, RN Phone Number: 06/29/2024, 2:42 PM  Clinical Narrative:   RNCM met with patient in his room.  He lives with uncle and wants to return there once discharged.  Discussed inpatient and outpatient substance abuse resources and given to patient.  He prefers outpatient.  Does not meet  criteria for inpatient rehab PT/PT at this time.  Will continue to follow until discharge.                       Expected Discharge Plan and Services                                               Social Drivers of Health (SDOH) Interventions SDOH Screenings   Food Insecurity: No Food Insecurity (06/20/2024)  Housing: Low Risk (06/20/2024)  Transportation Needs: No Transportation Needs (06/20/2024)  Utilities: Not At Risk (06/20/2024)  Tobacco Use: Low Risk (06/19/2024)    Readmission Risk Interventions     No data to display

## 2024-06-29 NOTE — Progress Notes (Signed)
 Physical Therapy Treatment Patient Details Name: Alejandro Collier MRN: [id number] DOB: 11/09/1985 Today's Date: 06/29/2024   History of Present Illness Pt is a 39 y/o M admitted on 06/19/24 after presenting with seizure following alcohol withdrawal. Pt intubated on 06/23/24, self extubated on 06/26/24. PMH: HTN, renal disorder (IGA/MCD syndrome), GERD, chronic dyspnea, alcohol abuse    PT Comments  Pt seen for PT tx with pt agreeable. Pt pleasant, continues to demonstrate very poor processing, awareness. Pt requires cuing to locate toilet flusher, wash hands, locate towels after hand hygiene. Pt requires min assist for gait, impaired gait pattern as noted below. HR improved with activity today. Pt performed exercises focusing on quads & hip extensor/glute strengthening as pt very limited by this. Continue to recommend post acute rehab >3 hours therapy/day upon d/c.    If plan is discharge home, recommend the following: A little help with walking and/or transfers;A little help with bathing/dressing/bathroom;Assistance with cooking/housework;Assist for transportation;Direct supervision/assist for medications management;Help with stairs or ramp for entrance;Direct supervision/assist for financial management;Supervision due to cognitive status   Can travel by private vehicle        Equipment Recommendations  Other (comment) (TBD)    Recommendations for Other Services       Precautions / Restrictions Precautions Precautions: Fall Precaution/Restrictions Comments: seizures, watch HR Restrictions Weight Bearing Restrictions Per Provider Order: No     Mobility  Bed Mobility Overal bed mobility: Needs Assistance Bed Mobility: Sidelying to Sit, Sit to Sidelying   Sidelying to sit: Supervision     Sit to sidelying: Supervision      Transfers Overall transfer level: Needs assistance Equipment used: 1 person hand held assist Transfers: Sit to/from Stand Sit to Stand: Min assist                 Ambulation/Gait Ambulation/Gait assistance: Min assist Gait Distance (Feet): 155 Feet Assistive device: None Gait Pattern/deviations: Decreased step length - left, Decreased step length - right, Decreased dorsiflexion - left, Decreased stride length, Decreased dorsiflexion - right, Wide base of support Gait velocity: decreased     General Gait Details: decreased BUE reciprocal arm swing   Stairs             Wheelchair Mobility     Tilt Bed    Modified Rankin (Stroke Patients Only)       Balance Overall balance assessment: Needs assistance Sitting-balance support: Feet supported Sitting balance-Leahy Scale: Good     Standing balance support: No upper extremity supported, During functional activity Standing balance-Leahy Scale: Poor                              Communication Communication Communication: Impaired Factors Affecting Communication: Reduced clarity of speech  Cognition Arousal: Alert Behavior During Therapy: Flat affect   PT - Cognitive impairments: Awareness, Memory, Attention, Initiation, Sequencing, Safety/Judgement, Problem solving                       PT - Cognition Comments: significantly increased processing time, unaware of difficulty managing secretions Following commands: Impaired Following commands impaired: Follows one step commands with increased time    Cueing Cueing Techniques: Verbal cues  Exercises Other Exercises Other Exercises: Pt performed 5x sit<>Stand from EOB without BLE leaning posteriorly on EOB; pt initially with LOB posteriorly requiring assistance for safety, then able to complete 5 reps with min assist. Other Exercises: Pt performed 5x mini squats with very  poor quad, hip extensor/glute strength, difficulty flexing hips & knees to squat, requires min assist & BUE support.    General Comments General comments (skin integrity, edema, etc.): max HR 112 bpm with mobility (nurse made  aware), pt with continent void standing in bathroom      Pertinent Vitals/Pain Pain Assessment Pain Assessment: No/denies pain    Home Living                          Prior Function            PT Goals (current goals can now be found in the care plan section) Acute Rehab PT Goals Patient Stated Goal: get better PT Goal Formulation: With patient Time For Goal Achievement: 07/12/24 Potential to Achieve Goals: Good Progress towards PT goals: Progressing toward goals    Frequency    Min 3X/week      PT Plan      Co-evaluation              AM-PAC PT 6 Clicks Mobility   Outcome Measure  Help needed turning from your back to your side while in a flat bed without using bedrails?: None Help needed moving from lying on your back to sitting on the side of a flat bed without using bedrails?: A Little Help needed moving to and from a bed to a chair (including a wheelchair)?: A Little Help needed standing up from a chair using your arms (e.g., wheelchair or bedside chair)?: A Little Help needed to walk in hospital room?: A Little Help needed climbing 3-5 steps with a railing? : A Little 6 Click Score: 19    End of Session   Activity Tolerance: Patient tolerated treatment well Patient left: in bed;with call bell/phone within reach;with bed alarm set Nurse Communication: Mobility status (HR with gait) PT Visit Diagnosis: Unsteadiness on feet (R26.81);Other abnormalities of gait and mobility (R26.89);Muscle weakness (generalized) (M62.81);Difficulty in walking, not elsewhere classified (R26.2)     Time: 8683-8665 PT Time Calculation (min) (ACUTE ONLY): 18 min  Charges:    $Therapeutic Activity: 8-22 mins PT General Charges $$ ACUTE PT VISIT: 1 Visit                     Alejandro Collier, PT, DPT 06/29/24, 1:43 PM   Alejandro Collier 06/29/2024, 1:42 PM

## 2024-06-29 NOTE — Progress Notes (Signed)
 Occupational Therapy Treatment Patient Details Name: Alejandro Collier MRN: [id number] DOB: Oct 18, 1985 Today's Date: 06/29/2024   History of present illness Pt is a 39 y/o M admitted on 06/19/24 after presenting with seizure following alcohol withdrawal. Pt intubated on 06/23/24, self extubated on 06/26/24. PMH: HTN, renal disorder (IGA/MCD syndrome), GERD, chronic dyspnea, alcohol abuse   OT comments  Upon entering the room, pt supine in bed and agreeable to OT intervention. Pt needing to be aroused for participation several times secondary to him reporting, I just feel tired. Pt performs bed mobility with supervision. Pt given hospital scrub pants and needing min A to thread onto feet. Pt missing leg opening several times and continuing to attempt to pull up LE even though foot is not threaded into opening. Pt stands with min A to pull over B hips. Ambulation of 150' with min guard in hallway. He remembers room number from yesterday but needs min cuing to navigate back to room location. Pt sits on EOB to look at menu and needing verbal guidance cuing to call and place lunch order. Pt remained on EOB with bed alarm activated and all needs within reach.       If plan is discharge home, recommend the following:  A little help with walking and/or transfers;A little help with bathing/dressing/bathroom;Assistance with cooking/housework;Direct supervision/assist for medications management;Direct supervision/assist for financial management;Assist for transportation;Help with stairs or ramp for entrance;Supervision due to cognitive status   Equipment Recommendations  Other (comment) (defer)       Precautions / Restrictions Precautions Precautions: Fall Precaution/Restrictions Comments: seizures, watch HR       Mobility Bed Mobility Overal bed mobility: Needs Assistance Bed Mobility: Sidelying to Sit, Sit to Supine   Sidelying to sit: Supervision   Sit to supine: Supervision         Transfers Overall transfer level: Needs assistance Equipment used: 1 person hand held assist Transfers: Sit to/from Stand Sit to Stand: Min assist, Contact guard assist                 Balance Overall balance assessment: Needs assistance Sitting-balance support: Feet supported Sitting balance-Leahy Scale: Good     Standing balance support: Single extremity supported, No upper extremity supported, During functional activity Standing balance-Leahy Scale: Fair                             ADL either performed or assessed with clinical judgement   ADL Overall ADL's : Needs assistance/impaired                     Lower Body Dressing: Minimal assistance;Sit to/from stand                      Extremity/Trunk Assessment Upper Extremity Assessment Upper Extremity Assessment: Generalized weakness   Lower Extremity Assessment Lower Extremity Assessment: Generalized weakness        Vision Patient Visual Report: No change from baseline           Communication Communication Communication: Impaired Factors Affecting Communication: Reduced clarity of speech   Cognition Arousal: Alert Behavior During Therapy: Flat affect Cognition: Cognition impaired   Orientation impairments: Time Awareness: Intellectual awareness impaired, Online awareness impaired   Attention impairment (select first level of impairment): Sustained attention Executive functioning impairment (select all impairments): Sequencing, Reasoning, Problem solving  Following commands: Impaired Following commands impaired: Follows one step commands with increased time      Cueing   Cueing Techniques: Verbal cues             Pertinent Vitals/ Pain       Pain Assessment Pain Assessment: No/denies pain         Frequency  Min 3X/week        Progress Toward Goals  OT Goals(current goals can now be found in the care plan section)  Progress  towards OT goals: Progressing toward goals      AM-PAC OT 6 Clicks Daily Activity     Outcome Measure   Help from another person eating meals?: None Help from another person taking care of personal grooming?: A Little Help from another person toileting, which includes using toliet, bedpan, or urinal?: A Little Help from another person bathing (including washing, rinsing, drying)?: A Little Help from another person to put on and taking off regular upper body clothing?: A Little Help from another person to put on and taking off regular lower body clothing?: A Little 6 Click Score: 19    End of Session    OT Visit Diagnosis: Unsteadiness on feet (R26.81);Muscle weakness (generalized) (M62.81)   Activity Tolerance Patient tolerated treatment well   Patient Left in bed;with call bell/phone within reach;with bed alarm set   Nurse Communication Mobility status        Time: 8977-8954 OT Time Calculation (min): 23 min  Charges: OT General Charges $OT Visit: 1 Visit OT Treatments $Self Care/Home Management : 8-22 mins $Therapeutic Activity: 8-22 mins  Izetta Claude, MS, OTR/L , CBIS ascom [phone]  06/29/24, 10:55 AM

## 2024-06-30 DIAGNOSIS — I10 Essential (primary) hypertension: Secondary | ICD-10-CM

## 2024-06-30 LAB — CULTURE, BLOOD (ROUTINE X 2): Culture: NO GROWTH

## 2024-06-30 MED ORDER — AMLODIPINE BESYLATE 5 MG PO TABS
5.0000 mg | ORAL_TABLET | Freq: Every day | ORAL | Status: DC
  Administered 2024-06-30: 5 mg via ORAL
  Filled 2024-06-30: qty 1

## 2024-06-30 MED ORDER — HALOPERIDOL LACTATE 5 MG/ML IJ SOLN: 2.0000 mg | Freq: Four times a day (QID) | INTRAMUSCULAR | Status: DC | PRN

## 2024-06-30 MED ORDER — METOPROLOL SUCCINATE ER 50 MG PO TB24
50.0000 mg | ORAL_TABLET | Freq: Every day | ORAL | Status: DC
  Administered 2024-06-30 – 2024-07-03 (×4): 50 mg via ORAL
  Filled 2024-06-30 (×4): qty 1

## 2024-06-30 NOTE — Progress Notes (Signed)
 Speech Language Pathology Treatment: Cognitive-Linguistic  Patient Details Name: Alejandro Collier MRN: [id number] DOB: January 06, 1986 Today's Date: 06/30/2024 Time: 8869-8855 SLP Time Calculation (min) (ACUTE ONLY): 14 min  Assessment / Plan / Recommendation Clinical Impression  Pt being followed by ST services to promote cognitive improvement for help in accessing medical interventions while hospitalized and for assistance in creating a safe discharge plan.  At this time, pt continues to display cognitive communication progress as evidenced by continued improvement in alertness, selective attention, orientation, emergent and anticipatory awareness, basic problem solving. He continues to have slow progressing but basic non-intoxicated abilities are not known. However suspect some baseline deficits as he lived with his uncle, his uncle took care of the bills and pt didn't drive.    As far as discharge plan is concerned, given pt's slow processing abilities, he would benefit from supervision to help with medication and money management as well as substance abuse. At this time, pt is aware and consenting to living with his mother.   At this time, further skilled ST services are no longer indicated and will sign off.    HPI HPI: Alejandro Collier is a 39 year old male with past medical history significant for hypertension, GERD, renal disorder, chronic dyspnea and alcohol use disorder admitted on 06/19/2024 with alcohol withdrawal and seizure witnessed while he was at work. It was reported his last drink was on 1/19.  He does have a history of seizures with alcohol withdrawal over a year ago.     Pt's hospitalization has been complicated by high level of agitation, violence and combativenes requiring Precedex . Pt intubated on 06/24/2024 d/t increase in secretions following sedation for aggression.   Pt self-extubated on 06/26/2024. Psychiatry consulted.      SLP Plan  All goals met          Recommendations   24 hour supervision at discharge from help with money and medication management as well as substance abuse.                               All goals met    Narmeen Kerper B. Rubbie, M.S., CCC-SLP, Tree Surgeon Certified Brain Injury Specialist [facility]  [facility] Rehabilitation Services Office [phone] Ascom [phone] Fax [phone]

## 2024-06-30 NOTE — Progress Notes (Signed)
 " Progress Note   Patient: Alejandro Collier FMW:[id number] DOB: 1986-04-27 DOA: 06/19/2024     10 DOS: the patient was seen and examined on 06/30/2024   Brief hospital course: 39 year old male with past medical history significant for hypertension, GERD, renal disorder, chronic dyspnea and alcohol use disorder admitted with alcohol withdrawal and seizure witnessed while he was at work. It was reported his last drink was on 1/19.  He does have a history of seizures with alcohol withdrawal over a year ago. He denied significant changes to his amount of alcohol intake prior to arrival.    ED Course: Upon arrival via EMS, it was reported he had one episode of vomiting en route. On presentation patient was tachycardic, tachypneic and hypotensive. He denied symptoms of nausea/vomiting, diarrhea or abdominal pain. Once arrived to the ED, he was acutely agitated requiring multiple doses of ativan  and started on IV fluids  1/19: Admitted following a witnessed seizure at home in the setting of alcohol withdrawal. Started on phenobarbital  taper with ativan  prn 1/20: Remained agitated with phenobarbital  and prn ativan  1/21: rapid response due to patient being agitated requiring high doses of Ativan  for alcohol withdraw was then started on phenobarbital  taper and on a Precedex  drip transferred to stepdown  1/22: Remained on precedex , phenobarb and prn ativan  with agitation and combativeness. 1/23: Still on precedex  gtt with acute agitation with spells of violence. PCCM consulted d/t precedex . Intubated for inability to protect airway after receiving phenobarbital  for increasing agitation overnight. 1/26: Pt remains mechanically intubated followed commands during WUA, however secretion burden precludes extubation.  Self extubated respiratory status stable on HHFNC, however pt agitated/delirious/violent with inability to redirect requiring haldol /precedex  gtt/bilateral soft wrist restraints  1/27: Pt self extubated on  01/26 stable from respiratory standpoint currently on RA.  Remains on precedex  gtt with intermittent agitation.  Speech and psychiatry consulted appreciate input   06/28/24.  Patient transferred to medical service.  Still having a little tremor will put on low-dose Librium .  Blood pressure and heart rate high will start low-dose clonidine  and Toprol .  Replace IV magnesium . 06/29/2024.  Looking into acute inpatient rehab.  Will titrate clonidine  up to 3 times daily dosing.  Not seeing a tremor today.  Discontinue folic acid  with thrombocytosis.  Replace IV magnesium  again today. 06/30/2024.  Add Norvasc  for high blood pressure.  Increase Toprol .  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  Seizure likely secondary to alcohol withdrawal.  EEG did not show any seizure activity.  Received phenobarbital  taper prior.  Received high-dose thiamine .  Placed on low-dose Librium  on 1/28.  Accelerated hypertension Blood pressure remains elevated despite starting clonidine  and adding Toprol .  Increase Toprol -XL to 50 mg daily and add Norvasc .  Titrate meds as needed.  Staphylococcus aureus pneumonia (HCC) Change vancomycin  and Zosyn  to ancef  on 1/28.  staphylococcus hominis and epidermidis seen in 1 blood culture bottle likely skin contaminant.  Alcoholic liver disease Transaminitis. Needs to stop drinking alcohol.  Hypokalemia Replaced  Hypomagnesemia Replaced  Leukocytosis White blood cell count trending up along with platelet count.  Continue to monitor  Thrombocytopenia Likely secondary to alcoholic liver disease. - Continue to monitor  Alcoholic ketoacidosis Improved  Thrombocytosis Likely secondary to pneumonia and folic acid .  Initially had thrombocytopenia secondary to alcohol.  Will discontinue folic acid .  Acute respiratory failure with hypoxia [facility]) Patient intubated on 1/23.  Self extubated on 1/26.  Patient  off oxygen.  Subjective: Patient states he feels okay.  Asking to go home.  Still having a little bit of a tremor.  Blood pressure still very high.  Titrating up blood pressure medications.  Physical Exam: Vitals:   06/30/24 0000 06/30/24 0415 06/30/24 0417 06/30/24 0800  BP: (!) 125/94 (!) 173/114 (!) 156/115 (!) 149/107  Pulse: 96  87 (!) 103  Resp:    20  Temp:   (!) 97.4 F (36.3 C) 98.5 F (36.9 C)  TempSrc:   Oral   SpO2:   99% 94%  Weight:   62.3 kg   Height:       Physical Exam HENT:     Head: Normocephalic.  Eyes:     General: Lids are normal.     Conjunctiva/sclera: Conjunctivae normal.  Cardiovascular:     Rate and Rhythm: Regular rhythm. Tachycardia present.     Heart sounds: Normal heart sounds, S1 normal and S2 normal.  Pulmonary:     Breath sounds: No decreased breath sounds, wheezing, rhonchi or rales.  Abdominal:     Palpations: Abdomen is soft.     Tenderness: There is no abdominal tenderness.  Musculoskeletal:     Right lower leg: No swelling.     Left lower leg: No swelling.  Skin:    General: Skin is warm.     Findings: No rash.  Neurological:     Mental Status: He is alert.     Comments: Slight tremor     Data Reviewed: No new data  Family Communication: Spoke with mother on the phone  Disposition: Status is: Inpatient Remains inpatient appropriate because: Patient's blood pressure is still very high.  Adding Norvasc  today.  Planned Discharge Destination: Will have to come up with a disposition plan for him.    Time spent: 28 minutes  Author: Charlie Patterson, MD 06/30/2024 12:53 PM  For on call review [email or web address].  "

## 2024-06-30 NOTE — Assessment & Plan Note (Deleted)
 Likely secondary to alcoholic liver disease. - Continue to monitor

## 2024-06-30 NOTE — Plan of Care (Signed)
  Problem: Health Behavior/Discharge Planning: Goal: Ability to manage health-related needs will improve Outcome: Progressing   Problem: Clinical Measurements: Goal: Will remain free from infection Outcome: Progressing   Problem: Activity: Goal: Risk for activity intolerance will decrease Outcome: Progressing   Problem: Elimination: Goal: Will not experience complications related to urinary retention Outcome: Progressing

## 2024-06-30 NOTE — Assessment & Plan Note (Signed)
 Blood pressure up and down.  On clonidine , Toprol , Aldactone  and Norvasc .  Will try to decrease clonidine  down to twice a day instead of 3 times a day.

## 2024-06-30 NOTE — Progress Notes (Signed)
 Occupational Therapy Treatment Patient Details Name: Alejandro Collier MRN: [id number] DOB: 23-Feb-1986 Today's Date: 06/30/2024   History of present illness Pt is a 39 y/o M admitted on 06/19/24 after presenting with seizure following alcohol withdrawal. Pt intubated on 06/23/24, self extubated on 06/26/24. PMH: HTN, renal disorder (IGA/MCD syndrome), GERD, chronic dyspnea, alcohol abuse   OT comments  Upon entering the room, pt supine in bed and agreeable to OT intervention. Pt performs bed mobility with supervision and is given SLUM assessment.   Pt completed SLUMS examination this date scoring 17/30 indicating a positive screen for dementia. Of note, it is not within occupational therapy scope of practice to diagnose cognitive impairments, this screen indicates need for further testing. The SLUMS is a 30 point, 11 question screening questionnaire that tests orientation, memory, attention, and executive function. Pt with noted impairments in short term memory, problem solving, and executive function limiting ability to accurately and safety perform home management tasks, medication management, and money management.   Pt continues to have slow processing this session and needs increased time to complete tasks. Pt ambulates with supervision this session without use of AD. Pt continues to need direction to walk in circle around nurses station to locate his room. Pt returning to bed at end of session with bed alarm activated and all needs within reach.       If plan is discharge home, recommend the following:  A little help with walking and/or transfers;A little help with bathing/dressing/bathroom;Assistance with cooking/housework;Direct supervision/assist for medications management;Direct supervision/assist for financial management;Assist for transportation;Help with stairs or ramp for entrance;Supervision due to cognitive status   Equipment Recommendations  None recommended by OT       Precautions /  Restrictions Precautions Precautions: Fall Precaution/Restrictions Comments: seizures, watch HR       Mobility Bed Mobility Overal bed mobility: Needs Assistance Bed Mobility: Supine to Sit, Sit to Supine     Supine to sit: Supervision Sit to supine: Supervision        Transfers Overall transfer level: Needs assistance Equipment used: 1 person hand held assist Transfers: Sit to/from Stand Sit to Stand: Contact guard assist           General transfer comment: ambulation with supervision     Balance Overall balance assessment: Needs assistance Sitting-balance support: Feet supported Sitting balance-Leahy Scale: Good     Standing balance support: No upper extremity supported, During functional activity Standing balance-Leahy Scale: Poor                             ADL either performed or assessed with clinical judgement    Extremity/Trunk Assessment Upper Extremity Assessment Upper Extremity Assessment: Overall WFL for tasks assessed;Generalized weakness   Lower Extremity Assessment Lower Extremity Assessment: Generalized weakness        Vision Patient Visual Report: No change from baseline           Communication Communication Communication: Impaired Factors Affecting Communication: Reduced clarity of speech   Cognition Arousal: Alert Behavior During Therapy: Flat affect Cognition: Cognition impaired   Orientation impairments: Time Awareness: Intellectual awareness impaired, Online awareness impaired   Attention impairment (select first level of impairment): Sustained attention Executive functioning impairment (select all impairments): Sequencing, Reasoning, Problem solving OT - Cognition Comments: SLUMS of 17/30                 Following commands: Impaired Following commands impaired: Follows one step commands with  increased time      Cueing   Cueing Techniques: Verbal cues             Pertinent Vitals/ Pain        Pain Assessment Pain Assessment: No/denies pain         Frequency  Min 3X/week        Progress Toward Goals  OT Goals(current goals can now be found in the care plan section)  Progress towards OT goals: Progressing toward goals      AM-PAC OT 6 Clicks Daily Activity     Outcome Measure   Help from another person eating meals?: None Help from another person taking care of personal grooming?: None Help from another person toileting, which includes using toliet, bedpan, or urinal?: A Little Help from another person bathing (including washing, rinsing, drying)?: A Little Help from another person to put on and taking off regular upper body clothing?: None Help from another person to put on and taking off regular lower body clothing?: A Little 6 Click Score: 21    End of Session Equipment Utilized During Treatment: Rolling walker (2 wheels)  OT Visit Diagnosis: Unsteadiness on feet (R26.81);Muscle weakness (generalized) (M62.81)   Activity Tolerance Patient tolerated treatment well   Patient Left in bed;with call bell/phone within reach;with bed alarm set   Nurse Communication Mobility status        Time: 1035-1101 OT Time Calculation (min): 26 min  Charges: OT General Charges $OT Visit: 1 Visit OT Treatments $Therapeutic Activity: 23-37 mins  Izetta Claude, MS, OTR/L , CBIS ascom [phone]  06/30/24, 12:55 PM

## 2024-06-30 NOTE — Progress Notes (Signed)
 Introduced patient to role of statistician. Patient alert and oriented, able to hold basic conversation but does display cognitive impairment with slow processing skills.   Patient states he is aware his mother would like him to go into treatment for his alcohol use. Patient endorses drinking about a fifth a day for a long time. When asked about how he feels about inpatient substance abuse treatment, patient states yeah, when I get better.   Patient made aware that inpatient substance abuse places require independence with ADLs. Pt verbalized understanding of this and agreed that while he is making progress, he is not there yet.   Patient aware of substance abuse resources left at bedside by inpatient case management. Patient unable to verbalize exactly what he needed to do with those resources.   Patient states he was previously living with his uncle but guesses [he] will have to live with [his] mom now.   Verbal permission given to speak with mother. Will attempt phone call.

## 2024-06-30 NOTE — Progress Notes (Addendum)
 Physical Therapy Treatment Patient Details Name: Alejandro Collier MRN: [id number] DOB: July 09, 1985 Today's Date: 06/30/2024   History of Present Illness Pt is a 39 y/o M admitted on 06/19/24 after presenting with seizure following alcohol withdrawal. Pt intubated on 06/23/24, self extubated on 06/26/24. PMH: HTN, renal disorder (IGA/MCD syndrome), GERD, chronic dyspnea, alcohol abuse    PT Comments  Pt seen for PT tx with pt received sound asleep but eventually awakened & agreeable to tx. Pt is able to ambulate increased distances without AD with supervision, slightly improved gait pattern as compared to the past few days. Pt negotiates stairs with B rails & supervision, leaning R. Pt performed floor transfer with supervision. Pt requires extra time to locate room but is able to do so without assistance. Recommend ongoing PT services to progress mobility as able.   Daughter arrived at end of session, along with another visitor, and reports pt seems to be at his baseline in re: to speech, behavior.   If plan is discharge home, recommend the following: A little help with walking and/or transfers;A little help with bathing/dressing/bathroom;Assistance with cooking/housework;Assist for transportation;Direct supervision/assist for medications management;Help with stairs or ramp for entrance;Direct supervision/assist for financial management;Supervision due to cognitive status   Can travel by private vehicle        Equipment Recommendations  Rolling walker (2 wheels)    Recommendations for Other Services       Precautions / Restrictions Precautions Precautions: Fall Precaution/Restrictions Comments: seizures, watch HR Restrictions Weight Bearing Restrictions Per Provider Order: No     Mobility  Bed Mobility Overal bed mobility: Needs Assistance Bed Mobility: Sidelying to Sit, Sit to Sidelying   Sidelying to sit: HOB elevated, Modified independent (Device/Increase time) (exit R side of bed)      Sit to sidelying: Modified independent (Device/Increase time), HOB elevated      Transfers Overall transfer level: Needs assistance Equipment used: None Transfers: Sit to/from Stand Sit to Stand: Supervision           General transfer comment: posterior lean on EOB with BLE    Ambulation/Gait Ambulation/Gait assistance: Supervision Gait Distance (Feet):  (>150 ft) Assistive device: None Gait Pattern/deviations: Decreased step length - left, Decreased step length - right, Decreased dorsiflexion - left, Decreased stride length, Decreased dorsiflexion - right, Wide base of support Gait velocity: decreased     General Gait Details: decreased heel strike LLE compared to RLE   Stairs Stairs: Yes Stairs assistance: Supervision Stair Management: Two rails Number of Stairs: 4 (x2 (6)) General stair comments: Pt negotiates stairs, initially descending stairs backwards but then able to negotiate them again & descend facing forwards but with lean on R rail.   Wheelchair Mobility     Tilt Bed    Modified Rankin (Stroke Patients Only)       Balance Overall balance assessment: Needs assistance Sitting-balance support: Feet supported Sitting balance-Leahy Scale: Good     Standing balance support: No upper extremity supported, During functional activity Standing balance-Leahy Scale: Fair                              Hotel Manager: Impaired Factors Affecting Communication: Reduced clarity of speech  Cognition Arousal: Alert Behavior During Therapy: Flat affect   PT - Cognitive impairments: Awareness, Memory, Attention, Initiation, Sequencing, Safety/Judgement, Problem solving  PT - Cognition Comments: significantly increased processing time Following commands: Impaired Following commands impaired: Follows one step commands with increased time    Cueing Cueing Techniques: Verbal cues   Exercises      General Comments General comments (skin integrity, edema, etc.): Pt completed floor transfer with supervision.      Pertinent Vitals/Pain Pain Assessment Pain Assessment: No/denies pain    Home Living                          Prior Function            PT Goals (current goals can now be found in the care plan section) Acute Rehab PT Goals Patient Stated Goal: get better PT Goal Formulation: With patient Time For Goal Achievement: 07/12/24 Potential to Achieve Goals: Good Progress towards PT goals: Progressing toward goals    Frequency    Min 3X/week      PT Plan      Co-evaluation              AM-PAC PT 6 Clicks Mobility   Outcome Measure  Help needed turning from your back to your side while in a flat bed without using bedrails?: None Help needed moving from lying on your back to sitting on the side of a flat bed without using bedrails?: None Help needed moving to and from a bed to a chair (including a wheelchair)?: A Little Help needed standing up from a chair using your arms (e.g., wheelchair or bedside chair)?: A Little Help needed to walk in hospital room?: A Little Help needed climbing 3-5 steps with a railing? : A Little 6 Click Score: 20    End of Session   Activity Tolerance: Patient tolerated treatment well Patient left: in bed;with call bell/phone within reach;with bed alarm set;with family/visitor present   PT Visit Diagnosis: Unsteadiness on feet (R26.81);Other abnormalities of gait and mobility (R26.89);Muscle weakness (generalized) (M62.81);Difficulty in walking, not elsewhere classified (R26.2)     Time: 1415-1430 PT Time Calculation (min) (ACUTE ONLY): 15 min  Charges:    $Therapeutic Activity: 8-22 mins PT General Charges $$ ACUTE PT VISIT: 1 Visit                     [name], PT, DPT 06/30/24, 2:38 PM   [name] 06/30/2024, 2:37 PM

## 2024-06-30 NOTE — Progress Notes (Signed)
" ° °  Inpatient Rehabilitation Admissions Coordinator   Asked to reassess for possible [facility] admit by Dr Josette.. Admitted with seizures related to alcohol withdrawal. Patient lacks the medical need for continued hospital level rehab. Recommend other rehab venues to be pursued.  Heron Leavell, RN, MSN Rehab Admissions Coordinator [phone] 06/30/2024 12:47 PM  "

## 2024-07-01 ENCOUNTER — Inpatient Hospital Stay: Payer: Self-pay

## 2024-07-01 LAB — CBC
HCT: 34.2 % — ABNORMAL LOW (ref 39.0–52.0)
Hemoglobin: 11.1 g/dL — ABNORMAL LOW (ref 13.0–17.0)
MCH: 29.1 pg (ref 26.0–34.0)
MCHC: 32.5 g/dL (ref 30.0–36.0)
MCV: 89.5 fL (ref 80.0–100.0)
Platelets: 1113 10*3/uL (ref 150–400)
RBC: 3.82 MIL/uL — ABNORMAL LOW (ref 4.22–5.81)
RDW: 15.2 % (ref 11.5–15.5)
WBC: 19.6 10*3/uL — ABNORMAL HIGH (ref 4.0–10.5)
nRBC: 0 % (ref 0.0–0.2)

## 2024-07-01 LAB — BASIC METABOLIC PANEL WITH GFR
Anion gap: 15 (ref 5–15)
BUN: 15 mg/dL (ref 6–20)
CO2: 24 mmol/L (ref 22–32)
Calcium: 9.5 mg/dL (ref 8.9–10.3)
Chloride: 98 mmol/L (ref 98–111)
Creatinine, Ser: 0.54 mg/dL — ABNORMAL LOW (ref 0.61–1.24)
GFR, Estimated: >60 mL/min
Glucose, Bld: 102 mg/dL — ABNORMAL HIGH (ref 70–99)
Potassium: 4.2 mmol/L (ref 3.5–5.1)
Sodium: 136 mmol/L (ref 135–145)

## 2024-07-01 LAB — SYPHILIS: RPR W/REFLEX TO RPR TITER AND TREPONEMAL ANTIBODIES, TRADITIONAL SCREENING AND DIAGNOSIS ALGORITHM: RPR Ser Ql: NONREACTIVE

## 2024-07-01 LAB — MAGNESIUM: Magnesium: 1.6 mg/dL — ABNORMAL LOW (ref 1.7–2.4)

## 2024-07-01 LAB — VITAMIN B12: Vitamin B-12: 715 pg/mL (ref 180–914)

## 2024-07-01 MED ORDER — CHLORDIAZEPOXIDE HCL 10 MG PO CAPS
10.0000 mg | ORAL_CAPSULE | Freq: Two times a day (BID) | ORAL | Status: DC
  Administered 2024-07-01 – 2024-07-03 (×5): 10 mg via ORAL
  Filled 2024-07-01 (×5): qty 1

## 2024-07-01 MED ORDER — SPIRONOLACTONE 12.5 MG HALF TABLET
12.5000 mg | ORAL_TABLET | Freq: Every day | ORAL | Status: DC
  Administered 2024-07-01 – 2024-07-03 (×3): 12.5 mg via ORAL
  Filled 2024-07-01 (×3): qty 1

## 2024-07-01 MED ORDER — AMLODIPINE BESYLATE 10 MG PO TABS
10.0000 mg | ORAL_TABLET | Freq: Every day | ORAL | Status: DC
  Administered 2024-07-01 – 2024-07-03 (×3): 10 mg via ORAL
  Filled 2024-07-01 (×3): qty 1

## 2024-07-01 MED ORDER — GADOBUTROL 1 MMOL/ML IV SOLN
6.0000 mL | Freq: Once | INTRAVENOUS | Status: AC | PRN
  Administered 2024-07-01: 6 mL via INTRAVENOUS

## 2024-07-01 MED ORDER — THIAMINE MONONITRATE 100 MG PO TABS
100.0000 mg | ORAL_TABLET | Freq: Every day | ORAL | Status: DC
  Administered 2024-07-02 – 2024-07-03 (×2): 100 mg via ORAL
  Filled 2024-07-01 (×2): qty 1

## 2024-07-01 MED ORDER — MAGNESIUM SULFATE 4 GM/100ML IV SOLN
4.0000 g | Freq: Once | INTRAVENOUS | Status: AC
  Administered 2024-07-01: 4 g via INTRAVENOUS
  Filled 2024-07-01: qty 100

## 2024-07-01 NOTE — Progress Notes (Signed)
 Mobility Specialist - Progress Note   07/01/24 1354  Mobility  Activity Ambulated with assistance  Level of Assistance Contact guard assist, steadying assist  Assistive Device Front wheel walker  Distance Ambulated (ft) 160 ft  Activity Response Tolerated well  Mobility visit 1 Mobility  Mobility Specialist Start Time (ACUTE ONLY) 1331  Mobility Specialist Stop Time (ACUTE ONLY) 1339  Mobility Specialist Time Calculation (min) (ACUTE ONLY) 8 min   America Silvan Mobility Specialist 07/01/24 1:55 PM

## 2024-07-01 NOTE — Plan of Care (Signed)
   Problem: Activity: Goal: Risk for activity intolerance will decrease Outcome: Progressing   Problem: Nutrition: Goal: Adequate nutrition will be maintained Outcome: Progressing

## 2024-07-01 NOTE — Assessment & Plan Note (Signed)
 Likely secondary to alcoholic liver disease. - Continue to monitor

## 2024-07-01 NOTE — Progress Notes (Signed)
 " Progress Note   Patient: Alejandro Collier FMW:[id number] DOB: June 10, 1985 DOA: 06/19/2024     11 DOS: the patient was seen and examined on 07/01/2024   Brief hospital course: 39 year old male with past medical history significant for hypertension, GERD, renal disorder, chronic dyspnea and alcohol use disorder admitted with alcohol withdrawal and seizure witnessed while he was at work. It was reported his last drink was on 1/19.  He does have a history of seizures with alcohol withdrawal over a year ago. He denied significant changes to his amount of alcohol intake prior to arrival.    ED Course: Upon arrival via EMS, it was reported he had one episode of vomiting en route. On presentation patient was tachycardic, tachypneic and hypotensive. He denied symptoms of nausea/vomiting, diarrhea or abdominal pain. Once arrived to the ED, he was acutely agitated requiring multiple doses of ativan  and started on IV fluids  1/19: Admitted following a witnessed seizure at home in the setting of alcohol withdrawal. Started on phenobarbital  taper with ativan  prn 1/20: Remained agitated with phenobarbital  and prn ativan  1/21: rapid response due to patient being agitated requiring high doses of Ativan  for alcohol withdraw was then started on phenobarbital  taper and on a Precedex  drip transferred to stepdown  1/22: Remained on precedex , phenobarb and prn ativan  with agitation and combativeness. 1/23: Still on precedex  gtt with acute agitation with spells of violence. PCCM consulted d/t precedex . Intubated for inability to protect airway after receiving phenobarbital  for increasing agitation overnight. 1/26: Pt remains mechanically intubated followed commands during WUA, however secretion burden precludes extubation.  Self extubated respiratory status stable on HHFNC, however pt agitated/delirious/violent with inability to redirect requiring haldol /precedex  gtt/bilateral soft wrist restraints  1/27: Pt self extubated on  01/26 stable from respiratory standpoint currently on RA.  Remains on precedex  gtt with intermittent agitation.  Speech and psychiatry consulted appreciate input   06/28/24.  Patient transferred to medical service.  Still having a little tremor will put on low-dose Librium .  Blood pressure and heart rate high will start low-dose clonidine  and Toprol .  Replace IV magnesium . 06/29/2024.  Looking into acute inpatient rehab.  Will titrate clonidine  up to 3 times daily dosing.  Not seeing a tremor today.  Discontinue folic acid  with thrombocytosis.  Replace IV magnesium  again today. 06/30/2024.  Add Norvasc  for high blood pressure.  Increase Toprol .  Assessment and Plan: * Accelerated hypertension Blood pressure up and down.  On clonidine  and Toprol .  Increase dose of Norvasc  today.  Added Aldactone  this afternoon.  Thrombocytosis Case discussed with hematology.  Likely reactive in nature.  Patient initially had thrombocytopenia on admission.  Treating for pneumonia.  Continue to monitor.  Staphylococcus aureus pneumonia (HCC) Change vancomycin  and Zosyn  to ancef  on 1/28.  staphylococcus hominis and epidermidis seen in 1 blood culture bottle likely skin contaminant.  White blood cell count still elevated.  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  Seizure likely secondary to alcohol withdrawal.  EEG did not show any seizure activity.  Received phenobarbital  taper prior.  Received high-dose thiamine .  Placed on low-dose Librium  on 1/28 and will taper down to twice a day on 1/31.  MRI of the brain ordered since I do not see 1 on record here.  Hypomagnesemia Replace 4 g IV today  Alcoholic liver disease Transaminitis. Needs to stop drinking alcohol.  Hypokalemia Replaced  Leukocytosis White blood cell count trending up along with platelet count.  Continue to monitor.  Likely reactive in nature.  On antibiotics for  pneumonia.  Thrombocytopenia Likely secondary to alcoholic liver disease. - Continue to monitor  Alcoholic ketoacidosis Improved  Acute respiratory failure with hypoxia [facility]) Patient intubated on 1/23.  Self extubated on 1/26.  Patient off oxygen.        Subjective: Patient wants to get home.  Blood pressure still up-and-down.  Platelet count still high.  Initially admitted with alcohol withdrawal seizure.  Physical Exam: Vitals:   07/01/24 0355 07/01/24 0526 07/01/24 0707 07/01/24 0850  BP: (!) 149/101 (!) 153/102 (!) 150/106 (!) 130/94  Pulse: 66 74 86   Resp:    16  Temp: 98.5 F (36.9 C)   (!) 97.4 F (36.3 C)  TempSrc: Oral     SpO2: 99%   98%  Weight: 63.6 kg     Height:       Physical Exam HENT:     Head: Normocephalic.  Eyes:     General: Lids are normal.     Conjunctiva/sclera: Conjunctivae normal.  Cardiovascular:     Rate and Rhythm: Normal rate and regular rhythm.     Heart sounds: Normal heart sounds, S1 normal and S2 normal.  Pulmonary:     Breath sounds: No decreased breath sounds, wheezing, rhonchi or rales.  Abdominal:     Palpations: Abdomen is soft.     Tenderness: There is no abdominal tenderness.  Musculoskeletal:     Right lower leg: No swelling.     Left lower leg: No swelling.  Skin:    General: Skin is warm.     Findings: No rash.  Neurological:     Mental Status: He is alert.     Data Reviewed: Creatinine 0.54, sodium 136, white blood count 19.6, platelet count 1,113,000.,  Hemoglobin 11.1  Family Communication: Spoke with patient's mother on the phone  Disposition: Status is: Inpatient Remains inpatient appropriate because: Platelet count continues to rise.  Will watch again tomorrow.  Magnesium  still low will give IV magnesium .  Blood pressure still high titrating up Norvasc  and added Aldactone  this afternoon  Planned Discharge Destination: Home with Home Health    Time spent: 28 minutes  Author: Charlie Patterson,  MD 07/01/2024 2:12 PM  For on call review [email or web address].  "

## 2024-07-02 DIAGNOSIS — F10939 Alcohol use, unspecified with withdrawal, unspecified: Secondary | ICD-10-CM

## 2024-07-02 LAB — CBC WITH DIFFERENTIAL/PLATELET
Abs Immature Granulocytes: 0.81 10*3/uL — ABNORMAL HIGH (ref 0.00–0.07)
Basophils Absolute: 0.3 10*3/uL — ABNORMAL HIGH (ref 0.0–0.1)
Basophils Relative: 2 %
Eosinophils Absolute: 0.5 10*3/uL (ref 0.0–0.5)
Eosinophils Relative: 3 %
HCT: 35.6 % — ABNORMAL LOW (ref 39.0–52.0)
Hemoglobin: 11.7 g/dL — ABNORMAL LOW (ref 13.0–17.0)
Immature Granulocytes: 5 %
Lymphocytes Relative: 13 %
Lymphs Abs: 2.4 10*3/uL (ref 0.7–4.0)
MCH: 29.3 pg (ref 26.0–34.0)
MCHC: 32.9 g/dL (ref 30.0–36.0)
MCV: 89 fL (ref 80.0–100.0)
Monocytes Absolute: 2.7 10*3/uL — ABNORMAL HIGH (ref 0.1–1.0)
Monocytes Relative: 15 %
Neutro Abs: 11.3 10*3/uL — ABNORMAL HIGH (ref 1.7–7.7)
Neutrophils Relative %: 62 %
Platelets: 1108 10*3/uL (ref 150–400)
RBC: 4 MIL/uL — ABNORMAL LOW (ref 4.22–5.81)
RDW: 15.1 % (ref 11.5–15.5)
WBC: 17.9 10*3/uL — ABNORMAL HIGH (ref 4.0–10.5)
nRBC: 0 % (ref 0.0–0.2)

## 2024-07-02 LAB — MAGNESIUM: Magnesium: 1.8 mg/dL (ref 1.7–2.4)

## 2024-07-02 MED ORDER — MAGNESIUM OXIDE -MG SUPPLEMENT 400 (240 MG) MG PO TABS
400.0000 mg | ORAL_TABLET | Freq: Every day | ORAL | Status: DC
  Administered 2024-07-02 – 2024-07-03 (×2): 400 mg via ORAL
  Filled 2024-07-02 (×2): qty 1

## 2024-07-02 MED ORDER — CLONIDINE HCL 0.1 MG PO TABS
0.1000 mg | ORAL_TABLET | Freq: Two times a day (BID) | ORAL | Status: DC
  Administered 2024-07-02 – 2024-07-03 (×3): 0.1 mg via ORAL
  Filled 2024-07-02 (×3): qty 1

## 2024-07-02 NOTE — Consult Note (Cosign Needed)
 [facility] Psychiatric Consult Follow-up  Patient Name: .Alejandro Collier  MRN: [id number]  DOB: 1985/07/07  Consult Order details:  Orders (From admission, onward)     Start     Ordered   06/27/24 0926  IP CONSULT TO PSYCHIATRY       Ordering Provider: Maranda Lonell MATSU, NP  Provider:  Donnelly Mellow, MD  Question Answer Comment  Location [facility]   Reason for Consult? Pt admitted with ETOH withdrawal now with severe agitation/delirium/violent behavior      06/27/24 0926             Mode of Visit: In person    Psychiatry Consult Evaluation  Service Date: July 02, 2024 LOS:  LOS: 12 days  Chief Complaint I am ready to go home  Primary Psychiatric Diagnoses  Alcohol Use Disorder with withdrawal   Assessment  CLINICAL DECISION MAKING:  SUBJECTIVE: Psychiatry consulted by primary medical team for severe agitation. Per primary team, patient is 39 year old male with history of severe alcohol use disorder and previous alcohol withdrawal seizures who presented to hospital on 06/19/2024 with altered mental status and seizure in setting of alcohol withdrawal. Admitted to ICU where he required intubation. Self-extubated yesterday, oriented only to place and person at that time. Since extubation, patient delirious and agitated with violent behaviors toward staff. Primary team reports difficulty redirecting patient and continued need for Precedex  drip due to current agitation.   OBJECTIVE: Psychiatric evaluation limited due to patient currently on Precedex  drip. Patient on high flow nasal cannula. Prior to this provider entering room, patient observed speaking to self and mumbling phrases without anyone present. Patient able to state current year is 2026 and his name. Disoriented to location, events leading to hospitalization, and month. Most recent EKG on 06/19/2024 shows QTc of 448.   06/28/2024: Patient was seen on rounds today by psychiatry and appeared  significantly more alert compared to yesterday's assessment. Patient demonstrated intact orientation to person, place, and time, accurately reporting he is currently at [facility], the year is 2026, and the month is January. He reports currently residing with his uncle and describes having a good relationship with his family. Patient denies current suicidal ideations, homicidal ideations, and auditory or visual hallucinations. On current mental status examination, there was no evidence of psychosis, mania, or patient responding to internal stimuli.  Regarding substance use history, patient endorses consuming approximately one-fifth of liquor daily, which has been ongoing for over one year. He denied drinking alcohol with intent to kill or harm himself. Patient denies any history of previous suicide attempts, self-harm attempts, or prior inpatient psychiatric admissions.  On today's assessment, patient was agreeable to this provider coordinating with social work to provide substance use treatment resources for outpatient follow-up. Patient declined medication management at this time. Of note, psychiatry recommended scheduled Haldol  2 mg three times daily due to severe aggressive behaviors that occurred following patient's self-extubation. Patient's behaviors appear to be improving with current interventions. Psychiatry will continue to follow patient throughout medical hospitalization to provide ongoing consultation to the medical team and assist with medication adjustments as clinically indicated. Social work was contacted by this provider today regarding need for substance use referral information and resources.   07/02/2024: Patient is assessed in the inpatient unit. He is found sitting on his bed using his cell phone, and in no acute distress. Alejandro Collier is alert and oriented to time, place, self, and situation. He is discharged focused today however understand the  reason for his admission. He reports that  he would like to transition to a 14 day inpatient substance abuse treatment center in [place], [place] ([facility]) however unsure if he is able to transition from an outpatient setting. Today, he denies all psychiatric symptoms, withdrawals, and there has been no further seizure activity. Patient has been calm and cooperative and there has been no use of PRN haldol  in the past 24 hours. He continues to take librium  twice daily. He denies suicidal or homicidal thoughts, hallucinations, or delusions. There are no safety concerns at this time.    Diagnoses:  Active Hospital problems: Principal Problem:   Accelerated hypertension Active Problems:   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, with unspecified complication (HCC)   Alcoholic liver disease   Leukocytosis   Thrombocytopenia   Transaminitis   Hypokalemia   Hypomagnesemia   Acute respiratory failure with hypoxia (HCC)   Seizure (HCC)   Elevated LFTs   Aspiration pneumonia (HCC)   Staphylococcus aureus pneumonia (HCC)   Thrombocytosis   Alcoholic ketoacidosis    Plan   ## Disposition: Psychiatry will follow as needed  ## Psychiatric Medication Recommendations:  Continue haldol  2mg  PRN for agitation He declines any further medications  ## Medical Decision Making Capacity: Not specifically addressed in this encounter  ## Further Work-up:  EKG- QTC: Labs:  ## Behavioral / Environmental: -Delirium Precautions: Delirium Interventions for Nursing and Staff: - RN to open blinds every AM. - To Bedside: Glasses, hearing aide, and pt's own shoes. Make available to patients. when possible and encourage use. - Encourage po fluids when appropriate, keep fluids within reach. - OOB to chair with meals. - Passive ROM exercises to all extremities with AM & PM care. - RN to assess orientation to person, time and place QAM and PRN. - Recommend extended visitation hours with familiar family/friends as feasible. - Staff to minimize disturbances at night. Turn off  television when pt asleep or when not in use.    ## Safety and Observation Level:  - Based on my clinical evaluation, I estimate the patient to be at LOW risk of self harm in the current setting. - At this time, we recommend  routine. This decision is based on my review of the chart including patient's history and current presentation, interview of the patient, mental status examination, and consideration of suicide risk including evaluating suicidal ideation, plan, intent, suicidal or self-harm behaviors, risk factors, and protective factors. This judgment is based on our ability to directly address suicide risk, implement suicide prevention strategies, and develop a safety plan while the patient is in the clinical setting. Please contact our team if there is a concern that risk level has changed.  CSSR Risk Category:C-SSRS RISK CATEGORY:  Flowsheet Row ED to Hosp-Admission (Current) from 06/19/2024 in [facility] 1C MEDICAL TELEMETRY ED from 03/23/2024 in [facility] Emergency Department at [facility] ED from 07/26/2023 in [facility] Emergency Department at [facility]  C-SSRS RISK CATEGORY No Risk No Risk No Risk     Suicide Risk Assessment: Patient has following modifiable risk factors for suicide: lack of access to outpatient mental health resources, which we are addressing by giving resources to substance abuse treatment, therapeutic communication.  Patient has following non-modifiable or demographic risk factors for suicide: male gender  Patient has the following protective factors against suicide: Supportive family and no history of suicide attempts  Thank you for this consult request. Recommendations have been communicated to the primary team.  We  will recommend PRN follow up at this time.       History of Present Illness  Relevant Aspects of [facility]   Patient Report:     Psychiatric and Social History  Psychiatric History:   Information collected from patient/chart  Prev Dx/Sx:  Current Psych Provider:  Home Meds (current):  Previous Med Trials:  Therapy:  Prior Psych Hospitalization:  Prior Suicide attempt/Self Harm:  Prior Violence:   Family Psych History:  Family Hx suicide:   Social History:  Educational Hx:  Occupational Hx:  Legal Hx:  Living Situation:  Spiritual Hx:  Access to weapons/lethal means: denies   Substance History Alcohol:  Last Drink : Number of drinks per day : History of alcohol withdrawal seizures: History of DT's: Tobacco:  Illicit drugs:  Prescription drug abuse:  Rehab hx:   Exam Findings  Physical Exam: Reviewed and agree with the physical exam findings conducted by the medical provider Vital Signs:  Temp:  [97.6 F (36.4 C)-99.5 F (37.5 C)] 97.7 F (36.5 C) (02/01 0726) Pulse Rate:  [73-89] 89 (02/01 0726) Resp:  [12-18] 18 (02/01 0726) BP: (100-134)/(60-98) 134/98 (02/01 0726) SpO2:  [95 %-100 %] 97 % (02/01 0726) Weight:  [63.5 kg] 63.5 kg (02/01 0500) Blood pressure (!) 134/98, pulse 89, temperature 97.7 F (36.5 C), temperature source Oral, resp. rate 18, height 5' 10 (1.778 m), weight 63.5 kg, SpO2 97%. Body mass index is 20.09 kg/m.    Mental Status Exam: General Appearance: Neat  Orientation:  Full (Time, Place, and Person)  Memory:  Immediate;   Good Recent;   Good Remote;   Good  Concentration:  Concentration: Good and Attention Span: Good  Recall:  Fair  Attention  Good  Eye Contact:  Fair  Speech:  Normal Rate  Language:  Good  Volume:  Normal  Mood: euthymic  Affect:  Congruent  Thought Process:  Coherent  Thought Content:  Negative  Suicidal Thoughts:  No  Homicidal Thoughts:  No  Judgement:  Fair  Insight:  Fair  Psychomotor Activity:  Normal  Akathisia:  No  Fund of Knowledge:  Good      Assets:  Communication Skills Desire for Improvement Housing Social Support  Cognition:  WNL  ADL's:  Intact  AIMS (if  indicated):        Other History   These have been pulled in through the EMR, reviewed, and updated if appropriate.  Family History:  The patient's family history is not on file.  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 IN 2016    Surgical History: Past Surgical History:  Procedure Laterality Date   ORIF ANKLE FRACTURE Right 07/30/2017   Procedure: OPEN REDUCTION INTERNAL FIXATION (ORIF) ANKLE FRACTURE;  Surgeon: Tobie Priest, MD;  Location: [facility] ORS;  Service: Orthopedics;  Laterality: Right;   SPLENECTOMY  2008   SPLENECTOMY     INJURED PLAYING SPORTS AND HAD INTERNAL BLEEDING   SYNDESMOSIS REPAIR Right 07/30/2017   Procedure: POSSIBLE SYNDESMOSIS REPAIR;  Surgeon: Tobie Priest, MD;  Location: [facility] ORS;  Service: Orthopedics;  Laterality: Right;   WISDOM  TOOTH EXTRACTION       Medications:  Current Medications[1]  Allergies: Allergies[2]  Fayetta Sorenson B Torunn Chancellor, NP     [1]  Current Facility-Administered Medications:    acetaminophen  (TYLENOL ) tablet 650 mg, 650 mg, Oral, Q6H PRN, 650 mg at 06/28/24 0247 **OR** acetaminophen  (TYLENOL ) suppository 650 mg, 650 mg, Rectal, Q6H PRN, Rust-Chester, Jenita L, NP   amLODipine  (NORVASC ) tablet 10 mg, 10 mg, Oral, Daily, Josette, Richard, MD, 10 mg at 07/02/24 9250   ceFAZolin  (ANCEF ) IVPB 2g/100 mL premix, 2 g, Intravenous, Q8H, Wieting, Richard, MD, Last Rate: 200 mL/hr at 07/02/24 0746, 2 g at 07/02/24 0746   chlordiazePOXIDE  (LIBRIUM ) capsule 10 mg, 10 mg, Oral, BID, Josette, Richard, MD, 10 mg at 07/02/24 9250   cloNIDine  (CATAPRES ) tablet 0.1 mg, 0.1 mg, Oral, BID, Josette, Richard, MD, 0.1 mg at 07/02/24 0749    enoxaparin  (LOVENOX ) injection 40 mg, 40 mg, Subcutaneous, Q24H, Cleatus Hoof V, MD, 40 mg at 07/02/24 9247   feeding supplement (ENSURE PLUS HIGH PROTEIN) liquid 237 mL, 237 mL, Oral, TID BM, Dgayli, Khabib, MD, 237 mL at 07/02/24 0749   guaiFENesin -dextromethorphan  (ROBITUSSIN DM) 100-10 MG/5ML syrup 5 mL, 5 mL, Oral, Q4H PRN, Isadora, Khabib, MD, 5 mL at 06/27/24 1800   haloperidol  lactate (HALDOL ) injection 2 mg, 2 mg, Intravenous, Q6H PRN, Josette, Richard, MD   hydrALAZINE  (APRESOLINE ) injection 10-20 mg, 10-20 mg, Intravenous, Q6H PRN, Bousman, Karlie, PA-C, 10 mg at 07/01/24 9471   labetalol  (NORMODYNE ) injection 10-20 mg, 10-20 mg, Intravenous, Q2H PRN, Bousman, Karlie, PA-C, 20 mg at 06/28/24 9388   LORazepam  (ATIVAN ) injection 2 mg, 2 mg, Intravenous, Q6H PRN, Josette Ade, MD   metoprolol  succinate (TOPROL -XL) 24 hr tablet 50 mg, 50 mg, Oral, Daily, Josette, Richard, MD, 50 mg at 07/02/24 9250   multivitamin with minerals tablet 1 tablet, 1 tablet, Oral, Daily, Rust-Chester, Jenita CROME, NP, 1 tablet at 07/02/24 0749   ondansetron  (ZOFRAN ) tablet 4 mg, 4 mg, Oral, Q6H PRN **OR** ondansetron  (ZOFRAN ) injection 4 mg, 4 mg, Intravenous, Q6H PRN, Rust-Chester, Jenita CROME, NP   Oral care mouth rinse, 15 mL, Mouth Rinse, PRN, Dgayli, Khabib, MD   spironolactone  (ALDACTONE ) tablet 12.5 mg, 12.5 mg, Oral, Daily, Wieting, Richard, MD, 12.5 mg at 07/02/24 0751   thiamine  (VITAMIN B1) tablet 100 mg, 100 mg, Oral, Daily, Nada Adriana BIRCH, RPH, 100 mg at 07/02/24 0749 [2] No Active Allergies

## 2024-07-02 NOTE — Progress Notes (Signed)
 " Progress Note   Patient: Alejandro Collier FMW:[id number] DOB: 03-11-1986 DOA: 06/19/2024     12 DOS: the patient was seen and examined on 07/02/2024   Brief hospital course: 39 year old male with past medical history significant for hypertension, GERD, renal disorder, chronic dyspnea and alcohol use disorder admitted with alcohol withdrawal and seizure witnessed while he was at work. It was reported his last drink was on 1/19.  He does have a history of seizures with alcohol withdrawal over a year ago. He denied significant changes to his amount of alcohol intake prior to arrival.    ED Course: Upon arrival via EMS, it was reported he had one episode of vomiting en route. On presentation patient was tachycardic, tachypneic and hypotensive. He denied symptoms of nausea/vomiting, diarrhea or abdominal pain. Once arrived to the ED, he was acutely agitated requiring multiple doses of ativan  and started on IV fluids  1/19: Admitted following a witnessed seizure at home in the setting of alcohol withdrawal. Started on phenobarbital  taper with ativan  prn 1/20: Remained agitated with phenobarbital  and prn ativan  1/21: rapid response due to patient being agitated requiring high doses of Ativan  for alcohol withdraw was then started on phenobarbital  taper and on a Precedex  drip transferred to stepdown  1/22: Remained on precedex , phenobarb and prn ativan  with agitation and combativeness. 1/23: Still on precedex  gtt with acute agitation with spells of violence. PCCM consulted d/t precedex . Intubated for inability to protect airway after receiving phenobarbital  for increasing agitation overnight. 1/26: Pt remains mechanically intubated followed commands during WUA, however secretion burden precludes extubation.  Self extubated respiratory status stable on HHFNC, however pt agitated/delirious/violent with inability to redirect requiring haldol /precedex  gtt/bilateral soft wrist restraints  1/27: Pt self extubated on  01/26 stable from respiratory standpoint currently on RA.  Remains on precedex  gtt with intermittent agitation.  Speech and psychiatry consulted appreciate input   06/28/24.  Patient transferred to medical service.  Still having a little tremor will put on low-dose Librium .  Blood pressure and heart rate high will start low-dose clonidine  and Toprol .  Replace IV magnesium . 06/29/2024.  Looking into acute inpatient rehab.  Will titrate clonidine  up to 3 times daily dosing.  Not seeing a tremor today.  Discontinue folic acid  with thrombocytosis.  Replace IV magnesium  again today. 06/30/2024.  Add Norvasc  for high blood pressure.  Increase Toprol .  Assessment and Plan: * Accelerated hypertension Blood pressure up and down.  On clonidine , Toprol , Aldactone  and Norvasc .  Will try to decrease clonidine  down to twice a day instead of 3 times a day.  Thrombocytosis Case discussed with hematology yesterday.  Likely reactive in nature.  Patient initially had thrombocytopenia on admission.  Treating for pneumonia.  Continue to monitor.  Platelet count slightly down today at 1108.  Will check again tomorrow.  Staphylococcus aureus pneumonia (HCC) Changed vancomycin  and Zosyn  to ancef  on 1/28.  staphylococcus hominis and epidermidis seen in 1 blood culture bottle likely skin contaminant.  White blood cell count still elevated but slightly better than yesterday.  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  Seizure likely secondary to alcohol withdrawal.  EEG did not show any seizure activity.  Received phenobarbital  taper prior.  Received high-dose thiamine .  Placed on low-dose Librium  on 1/28 and will taper down to twice a day on 1/31.  MRI of the brain negative.  Hypomagnesemia Oral supplementation  Alcoholic liver disease Transaminitis. Needs to stop drinking alcohol.  Hypokalemia Replaced  Leukocytosis  White blood cell count trending up along with  platelet count.  Continue to monitor.  Likely reactive in nature.  On antibiotics for pneumonia.  Thrombocytopenia Likely secondary to alcoholic liver disease. - Continue to monitor  Alcoholic ketoacidosis Improved  Acute respiratory failure with hypoxia [facility]) Patient intubated on 1/23.  Self extubated on 1/26.  Patient off oxygen.        Subjective: Patient feeling okay.  Blood pressures trending better.  Platelet count still very elevated but a little less than yesterday.  Little less shaky  Physical Exam: Vitals:   07/01/24 1942 07/02/24 0409 07/02/24 0500 07/02/24 0726  BP: 116/76 123/80  (!) 134/98  Pulse: 86 73  89  Resp: 12 12  18   Temp: 99.5 F (37.5 C) 98.2 F (36.8 C)  97.7 F (36.5 C)  TempSrc:    Oral  SpO2: 97% 100%  97%  Weight:   63.5 kg   Height:       Physical Exam HENT:     Head: Normocephalic.  Eyes:     General: Lids are normal.     Conjunctiva/sclera: Conjunctivae normal.  Cardiovascular:     Rate and Rhythm: Normal rate and regular rhythm.     Heart sounds: S1 normal and S2 normal. Murmur heard.     Systolic murmur is present with a grade of 2/6.  Pulmonary:     Breath sounds: No decreased breath sounds, wheezing, rhonchi or rales.  Abdominal:     Palpations: Abdomen is soft.     Tenderness: There is no abdominal tenderness.  Musculoskeletal:     Right lower leg: No swelling.     Left lower leg: No swelling.  Skin:    General: Skin is warm.     Findings: No rash.  Neurological:     Mental Status: He is alert.     Comments: Only slight tremor     Data Reviewed: Magnesium  1.8, white blood cell count 17.9, hemoglobin 11.7, platelet count 1108  Family Communication: Spoke with mother on phone  Disposition: Status is: Inpatient Remains inpatient appropriate because: If platelet count continues to trend in the right direction will discharge home tomorrow  Planned Discharge Destination: Home with Home Health    Time spent: 28  minutes  Author: Charlie Patterson, MD 07/02/2024 11:33 AM  For on call review [email or web address].  "

## 2024-07-03 ENCOUNTER — Other Ambulatory Visit: Payer: Self-pay

## 2024-07-03 DIAGNOSIS — R531 Weakness: Principal | ICD-10-CM

## 2024-07-03 LAB — CBC
HCT: 33.1 % — ABNORMAL LOW (ref 39.0–52.0)
Hemoglobin: 10.9 g/dL — ABNORMAL LOW (ref 13.0–17.0)
MCH: 29.3 pg (ref 26.0–34.0)
MCHC: 32.9 g/dL (ref 30.0–36.0)
MCV: 89 fL (ref 80.0–100.0)
Platelets: 1097 10*3/uL (ref 150–400)
RBC: 3.72 MIL/uL — ABNORMAL LOW (ref 4.22–5.81)
RDW: 15.1 % (ref 11.5–15.5)
WBC: 17.6 10*3/uL — ABNORMAL HIGH (ref 4.0–10.5)
nRBC: 0 % (ref 0.0–0.2)

## 2024-07-03 LAB — BASIC METABOLIC PANEL WITH GFR
Anion gap: 13 (ref 5–15)
BUN: 11 mg/dL (ref 6–20)
CO2: 25 mmol/L (ref 22–32)
Calcium: 9.3 mg/dL (ref 8.9–10.3)
Chloride: 98 mmol/L (ref 98–111)
Creatinine, Ser: 0.46 mg/dL — ABNORMAL LOW (ref 0.61–1.24)
GFR, Estimated: >60 mL/min
Glucose, Bld: 121 mg/dL — ABNORMAL HIGH (ref 70–99)
Potassium: 4.2 mmol/L (ref 3.5–5.1)
Sodium: 136 mmol/L (ref 135–145)

## 2024-07-03 LAB — MAGNESIUM: Magnesium: 1.6 mg/dL — ABNORMAL LOW (ref 1.7–2.4)

## 2024-07-03 LAB — HIV ANTIBODY (ROUTINE TESTING W REFLEX): HIV Screen 4th Generation wRfx: NONREACTIVE

## 2024-07-03 MED ORDER — ENSURE PLUS HIGH PROTEIN PO LIQD
237.0000 mL | Freq: Three times a day (TID) | ORAL | 0 refills | Status: AC
  Filled 2024-07-03: qty 21330, 30d supply, fill #0

## 2024-07-03 MED ORDER — METOPROLOL SUCCINATE ER 50 MG PO TB24
50.0000 mg | ORAL_TABLET | Freq: Every day | ORAL | 0 refills | Status: AC
  Filled 2024-07-03: qty 30, 30d supply, fill #0

## 2024-07-03 MED ORDER — AMLODIPINE BESYLATE 10 MG PO TABS
10.0000 mg | ORAL_TABLET | Freq: Every day | ORAL | 1 refills | Status: AC
  Filled 2024-07-03: qty 30, 30d supply, fill #0

## 2024-07-03 MED ORDER — AMOXICILLIN-POT CLAVULANATE 875-125 MG PO TABS
1.0000 | ORAL_TABLET | Freq: Two times a day (BID) | ORAL | Status: DC
  Administered 2024-07-03: 1 via ORAL
  Filled 2024-07-03: qty 1

## 2024-07-03 MED ORDER — CHLORDIAZEPOXIDE HCL 5 MG PO CAPS
ORAL_CAPSULE | ORAL | 0 refills | Status: AC
  Filled 2024-07-03: qty 11, 5d supply, fill #0

## 2024-07-03 MED ORDER — AMOXICILLIN-POT CLAVULANATE 875-125 MG PO TABS
1.0000 | ORAL_TABLET | Freq: Two times a day (BID) | ORAL | 0 refills | Status: AC
  Filled 2024-07-03: qty 4, 2d supply, fill #0

## 2024-07-03 MED ORDER — CLONIDINE HCL 0.1 MG PO TABS
0.1000 mg | ORAL_TABLET | Freq: Two times a day (BID) | ORAL | 1 refills | Status: AC
  Filled 2024-07-03: qty 60, 30d supply, fill #0

## 2024-07-03 MED ORDER — VITAMIN B-1 100 MG PO TABS
100.0000 mg | ORAL_TABLET | Freq: Every day | ORAL | 0 refills | Status: AC
  Filled 2024-07-03: qty 30, 30d supply, fill #0

## 2024-07-03 MED ORDER — SPIRONOLACTONE 25 MG PO TABS
12.5000 mg | ORAL_TABLET | Freq: Every day | ORAL | 0 refills | Status: AC
  Filled 2024-07-03: qty 30, 60d supply, fill #0

## 2024-07-03 MED ORDER — MAGNESIUM OXIDE 400 MG PO TABS
400.0000 mg | ORAL_TABLET | Freq: Every day | ORAL | 1 refills | Status: AC
  Filled 2024-07-03: qty 30, 30d supply, fill #0

## 2024-07-03 MED ORDER — MAGNESIUM SULFATE 4 GM/100ML IV SOLN
4.0000 g | Freq: Once | INTRAVENOUS | Status: AC
  Administered 2024-07-03: 4 g via INTRAVENOUS
  Filled 2024-07-03: qty 100

## 2024-07-03 NOTE — Progress Notes (Signed)
 Occupational Therapy Treatment Patient Details Name: Alejandro Collier MRN: [id number] DOB: December 02, 1985 Today's Date: 07/03/2024   History of present illness Pt is a 39 y/o M admitted on 06/19/24 after presenting with seizure following alcohol withdrawal. Pt intubated on 06/23/24, self extubated on 06/26/24. PMH: HTN, renal disorder (IGA/MCD syndrome), GERD, chronic dyspnea, alcohol abuse   OT comments  Upon entering the room, pt in bed with no c/o pain and agreeable to OT intervention. Pt performing bed mobility independently. Plans for possible discharge today. He is wearing disposable hospital scrubs and reports his clothing from admission is dirty. He donned B shoes prior to therapist arrival. Pt stands and ambulate without use of AD and ambulates 300' with supervision progressing to Mod I. Pt able to pick up items from floor without difficulty or LOB. Pt returning to room without issue. Pt endorses feeling confident about returning home. Call bell and all needed items within reach.       If plan is discharge home, recommend the following:  A little help with bathing/dressing/bathroom;Assistance with cooking/housework;Direct supervision/assist for medications management;Direct supervision/assist for financial management;Assist for transportation;Help with stairs or ramp for entrance;Supervision due to cognitive status   Equipment Recommendations  None recommended by OT       Precautions / Restrictions Precautions Precautions: Fall Precaution/Restrictions Comments: seizures, watch HR       Mobility Bed Mobility Overal bed mobility: Modified Independent                  Transfers Overall transfer level: Modified independent Equipment used: None                     Balance Overall balance assessment: Needs assistance Sitting-balance support: Feet supported Sitting balance-Leahy Scale: Normal     Standing balance support: No upper extremity supported, During functional  activity Standing balance-Leahy Scale: Good                             ADL either performed or assessed with clinical judgement   ADL                                         General ADL Comments: Pt reports donning B shoes prior to OT arrival    Extremity/Trunk Assessment Upper Extremity Assessment Upper Extremity Assessment: Overall WFL for tasks assessed   Lower Extremity Assessment Lower Extremity Assessment: Overall WFL for tasks assessed        Vision Patient Visual Report: No change from baseline               Cognition Arousal: Alert Behavior During Therapy: WFL for tasks assessed/performed Cognition: Cognition impaired             OT - Cognition Comments: Pt is more engaging today with conversation and processing information better than previous session                          Cueing   Cueing Techniques: Verbal cues             Pertinent Vitals/ Pain       Pain Assessment Pain Assessment: No/denies pain         Frequency  Min 3X/week        Progress Toward Goals  OT Goals(current goals can now  be found in the care plan section)  Progress towards OT goals: Progressing toward goals      AM-PAC OT 6 Clicks Daily Activity     Outcome Measure   Help from another person eating meals?: None Help from another person taking care of personal grooming?: None Help from another person toileting, which includes using toliet, bedpan, or urinal?: None Help from another person bathing (including washing, rinsing, drying)?: A Little Help from another person to put on and taking off regular upper body clothing?: None Help from another person to put on and taking off regular lower body clothing?: A Little 6 Click Score: 22    End of Session    OT Visit Diagnosis: Unsteadiness on feet (R26.81);Muscle weakness (generalized) (M62.81)   Activity Tolerance Patient tolerated treatment well   Patient Left in  bed;with call bell/phone within reach;with bed alarm set   Nurse Communication Mobility status        Time: 0948-1000 OT Time Calculation (min): 12 min  Charges: OT General Charges $OT Visit: 1 Visit OT Treatments $Therapeutic Activity: 8-22 mins  Izetta Claude, MS, OTR/L , CBIS ascom [phone]  07/03/24, 10:40 AM

## 2024-07-03 NOTE — Plan of Care (Signed)
  Problem: Clinical Measurements: Goal: Ability to maintain clinical measurements within normal limits will improve Outcome: Progressing Goal: Diagnostic test results will improve Outcome: Progressing Goal: Respiratory complications will improve Outcome: Progressing   Problem: Activity: Goal: Risk for activity intolerance will decrease Outcome: Progressing
